# Patient Record
Sex: Female | Born: 1990 | Race: White | Hispanic: No | Marital: Single | Smoking: Never smoker
Health system: Southern US, Community
[De-identification: ages and names within clinical notes are randomized; demographics above are authoritative.]

## PROBLEM LIST (undated history)

## (undated) ENCOUNTER — Inpatient Hospital Stay: Payer: Self-pay

## (undated) DIAGNOSIS — A539 Syphilis, unspecified: Secondary | ICD-10-CM

## (undated) HISTORY — DX: Syphilis, unspecified: A53.9

---
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
## 2009-02-15 ENCOUNTER — Observation Stay: Payer: Self-pay | Admitting: Obstetrics and Gynecology

## 2009-03-08 ENCOUNTER — Observation Stay: Payer: Self-pay

## 2009-03-16 ENCOUNTER — Inpatient Hospital Stay: Payer: Self-pay

## 2010-10-11 ENCOUNTER — Emergency Department: Payer: Self-pay | Admitting: Emergency Medicine

## 2011-04-08 ENCOUNTER — Emergency Department: Payer: Self-pay | Admitting: Emergency Medicine

## 2011-04-08 LAB — URINALYSIS, COMPLETE
Glucose,UR: NEGATIVE mg/dL (ref 0–75)
Ketone: NEGATIVE
Ph: 7 (ref 4.5–8.0)
RBC,UR: 129 /HPF (ref 0–5)
Specific Gravity: 1.023 (ref 1.003–1.030)
Squamous Epithelial: 4
WBC UR: 162 /HPF (ref 0–5)

## 2011-04-10 LAB — URINE CULTURE

## 2011-07-28 ENCOUNTER — Emergency Department: Payer: Self-pay | Admitting: Emergency Medicine

## 2011-07-28 LAB — COMPREHENSIVE METABOLIC PANEL
Anion Gap: 8 (ref 7–16)
BUN: 10 mg/dL (ref 7–18)
Bilirubin,Total: 0.3 mg/dL (ref 0.2–1.0)
Calcium, Total: 9.5 mg/dL (ref 8.5–10.1)
Chloride: 104 mmol/L (ref 98–107)
Creatinine: 0.78 mg/dL (ref 0.60–1.30)
EGFR (African American): >60
Glucose: 88 mg/dL (ref 65–99)
Potassium: 3.8 mmol/L (ref 3.5–5.1)
SGPT (ALT): 17 U/L
Sodium: 140 mmol/L (ref 136–145)
Total Protein: 8.1 g/dL (ref 6.4–8.2)

## 2011-07-28 LAB — URINALYSIS, COMPLETE
Bilirubin,UR: NEGATIVE
Ketone: NEGATIVE
Leukocyte Esterase: NEGATIVE
Nitrite: NEGATIVE
Ph: 5 (ref 4.5–8.0)
Protein: NEGATIVE
RBC,UR: 1 /HPF (ref 0–5)
Specific Gravity: 1.026 (ref 1.003–1.030)
Squamous Epithelial: 2
WBC UR: 1 /HPF (ref 0–5)

## 2011-07-28 LAB — CBC
MCH: 28.4 pg (ref 26.0–34.0)
Platelet: 228 10*3/uL (ref 150–440)
RBC: 4.7 10*6/uL (ref 3.80–5.20)
RDW: 12.9 % (ref 11.5–14.5)
WBC: 14.2 10*3/uL — ABNORMAL HIGH (ref 3.6–11.0)

## 2011-07-28 LAB — PREGNANCY, URINE: Pregnancy Test, Urine: NEGATIVE m[IU]/mL

## 2011-10-01 ENCOUNTER — Emergency Department: Payer: Self-pay | Admitting: *Deleted

## 2011-10-02 LAB — BASIC METABOLIC PANEL
BUN: 8 mg/dL (ref 7–18)
Calcium, Total: 9.2 mg/dL (ref 8.5–10.1)
Chloride: 106 mmol/L (ref 98–107)
Co2: 27 mmol/L (ref 21–32)
Glucose: 99 mg/dL (ref 65–99)
Osmolality: 280 (ref 275–301)
Potassium: 3.7 mmol/L (ref 3.5–5.1)
Sodium: 141 mmol/L (ref 136–145)

## 2011-10-02 LAB — URINALYSIS, COMPLETE
Bacteria: NONE SEEN
Bilirubin,UR: NEGATIVE
Blood: NEGATIVE
Glucose,UR: NEGATIVE mg/dL (ref 0–75)
Leukocyte Esterase: NEGATIVE
Ph: 6 (ref 4.5–8.0)
Specific Gravity: 1.009 (ref 1.003–1.030)
Squamous Epithelial: <1
WBC UR: <1 /HPF (ref 0–5)

## 2011-10-02 LAB — CBC
HCT: 38 % (ref 35.0–47.0)
HGB: 12.4 g/dL (ref 12.0–16.0)
MCH: 28.8 pg (ref 26.0–34.0)
MCHC: 32.6 g/dL (ref 32.0–36.0)
Platelet: 233 10*3/uL (ref 150–440)
RDW: 13.5 % (ref 11.5–14.5)
WBC: 13.8 10*3/uL — ABNORMAL HIGH (ref 3.6–11.0)

## 2011-10-02 LAB — WET PREP, GENITAL

## 2011-10-02 LAB — HCG, QUANTITATIVE, PREGNANCY: Beta Hcg, Quant.: 2137 m[IU]/mL — ABNORMAL HIGH

## 2011-10-02 LAB — PREGNANCY, URINE: Pregnancy Test, Urine: POSITIVE m[IU]/mL

## 2011-12-29 ENCOUNTER — Emergency Department: Payer: Self-pay | Admitting: Emergency Medicine

## 2011-12-29 LAB — URINALYSIS, COMPLETE
Bilirubin,UR: NEGATIVE
Blood: NEGATIVE
Ketone: NEGATIVE
Ph: 7 (ref 4.5–8.0)
Protein: NEGATIVE
Specific Gravity: 1.002 (ref 1.003–1.030)

## 2011-12-29 LAB — HCG, QUANTITATIVE, PREGNANCY: Beta Hcg, Quant.: 14374 m[IU]/mL — ABNORMAL HIGH

## 2011-12-29 LAB — CBC
HCT: 35.8 % (ref 35.0–47.0)
HGB: 12.3 g/dL (ref 12.0–16.0)
MCV: 88 fL (ref 80–100)
RDW: 13.4 % (ref 11.5–14.5)
WBC: 10.8 10*3/uL (ref 3.6–11.0)

## 2011-12-29 LAB — BASIC METABOLIC PANEL
Anion Gap: 11 (ref 7–16)
BUN: 4 mg/dL — ABNORMAL LOW (ref 7–18)
EGFR (African American): >60
EGFR (Non-African Amer.): >60
Glucose: 82 mg/dL (ref 65–99)
Osmolality: 272 (ref 275–301)

## 2012-04-25 ENCOUNTER — Observation Stay: Payer: Self-pay | Admitting: Obstetrics & Gynecology

## 2012-05-03 ENCOUNTER — Inpatient Hospital Stay: Payer: Self-pay | Admitting: Obstetrics and Gynecology

## 2012-05-03 LAB — CBC WITH DIFFERENTIAL/PLATELET
Basophil %: 0.4 %
Eosinophil #: 0.1 10*3/uL (ref 0.0–0.7)
Eosinophil %: 0.8 %
HCT: 29.5 % — ABNORMAL LOW (ref 35.0–47.0)
HGB: 9.5 g/dL — ABNORMAL LOW (ref 12.0–16.0)
Lymphocyte #: 2.3 10*3/uL (ref 1.0–3.6)
Lymphocyte %: 16.8 %
MCH: 27 pg (ref 26.0–34.0)
MCHC: 32.3 g/dL (ref 32.0–36.0)
MCV: 84 fL (ref 80–100)
Monocyte #: 1.4 x10 3/mm — ABNORMAL HIGH (ref 0.2–0.9)
Neutrophil #: 9.6 10*3/uL — ABNORMAL HIGH (ref 1.4–6.5)
Platelet: 205 10*3/uL (ref 150–440)
RBC: 3.53 10*6/uL — ABNORMAL LOW (ref 3.80–5.20)
RDW: 14 % (ref 11.5–14.5)

## 2012-05-04 LAB — HEMATOCRIT: HCT: 28.8 % — ABNORMAL LOW (ref 35.0–47.0)

## 2012-05-05 LAB — BETA STREP CULTURE(ARMC)

## 2013-10-19 ENCOUNTER — Emergency Department: Payer: Self-pay | Admitting: Emergency Medicine

## 2014-06-07 NOTE — Consult Note (Signed)
PREGNANCY and LABOR:  Maternal Age 25   Gravida 2   Para 1   Term Deliveries 1   Preterm Deliveries 0   Abortions 0   Living Children 1   Final EDD ([id number]) 01-Jun-2012   GA Assessment: (Weeks) 35 week(s)   (Days) 6 day(s)   Gestation Single   Blood Type (Maternal) O negative, s/p rhogam   Antibody Screen Results (Maternal) negative   Indirect [place] Unknown   HIV Results (Maternal) unknown   Gonorrhea Results (Maternal) negative   Chlamydia Results (Maternal) negative   Hepatitis C Culture (Maternal) unknown   Herpes Results (Maternal) n/a   Varicella Titer Results (Maternal) Unknown   VDRL/RPR/Syphilis Results (Maternal) negative   Rubella Results (Maternal) immune   Hepatitis B Surface Antigen Results (Maternal) negative   Group B Strep Results Maternal (Result >5wks must be treated as unknown) unknown/result > 5 weeks ago  GBS prophylaxis initiated, and sample obtained prior to treatment   GBS Prophylaxis Adequate   GBS Prophylaxis Date/Time 03-May-2012   GBS Prophylaxis hours prior to delivery >4   Other Maternal Labs PNV   Prenatal Care Adequate   Family/Social History has 24 year old daughter at home.   Labor Spontaneous   Pregnancy/Labor Complications Pre-term labor   Pregnancy/Labor Addendum Dating [place] at 5 weeks    Medications 1. PNV    Active Problems 1. PTL at [redacted] weeks gestation 2. GBS unknown   Newborn Classification: Newborn Classification: Prematurity, Other .   Comments I was asked to consult on Jaloni Menendez by Dr. Izora Gala for concerns of PTL at [redacted] weeks gestation with unknown GBS status.  Mom is O-, HIV and GBS unknown, with otherwise reassuring serologies.  Mom did receive Rhogam around [redacted] weeks gestation.  Mom reprots other child was term, so concerned about preterm delivery.  I reviewed common causes of late preterm delivery and possible needs for neonatal care. Briefly reviewed different needs of term verse late  preterm infants.  Mom expressed understanding and appreciation for the consultation, and I answered mom's questions.  Mom desires to bottle feed.  Thank you for allowing me to particiapte in her care.   Plan 1. Recommend GBS prophylaxis as per MMWR/CDC guidelines 2. Request follow-up on maternal HIV status 3. Recommend infant blood typing and DAT status due to maternal O- status 4. Please contact me if any additional questions or concerns arrise.   Additional Comments I spent 45 minutes between chart view, discussion with medical team, and face-to-face time with patient.   Parental Contact: Parental Contact: The parents were informed at length regarding the infant's condition and plan.  Thank you: Thank you for this consult..  Electronic Signatures: Eda Keys (MD)  (Signed 19-Mar-14 12:24)  Authored: PREGNANCY and LABOR, MEDICATIONS, ACTIVE PROBLEM LIST, NEWBORN CLASSIFICATION, ADDITIONAL COMMENTS, PARENTAL CONTACT, THANK YOU   Last Updated: 19-Mar-14 12:24 by Eda Keys (MD)

## 2014-06-23 ENCOUNTER — Emergency Department
Admission: EM | Admit: 2014-06-23 | Discharge: 2014-06-23 | Disposition: A | Discharge status: Home / Self Care | Payer: BLUE CROSS/BLUE SHIELD | Attending: Emergency Medicine | Admitting: Emergency Medicine

## 2014-06-23 ENCOUNTER — Encounter: Payer: Self-pay | Admitting: *Deleted

## 2014-06-23 DIAGNOSIS — H18823 Corneal disorder due to contact lens, bilateral: Secondary | ICD-10-CM | POA: Diagnosis not present

## 2014-06-23 DIAGNOSIS — H5713 Ocular pain, bilateral: Secondary | ICD-10-CM | POA: Diagnosis present

## 2014-06-23 MED ORDER — FLUORESCEIN SODIUM 1 MG OP STRP
ORAL_STRIP | OPHTHALMIC | Status: AC
  Administered 2014-06-23: 15:00:00
  Filled 2014-06-23: qty 1

## 2014-06-23 MED ORDER — EYE WASH OPHTH SOLN
OPHTHALMIC | Status: AC
  Administered 2014-06-23: 15:00:00
  Filled 2014-06-23: qty 118

## 2014-06-23 MED ORDER — TETRACAINE HCL 0.5 % OP SOLN
OPHTHALMIC | Status: AC
  Administered 2014-06-23: 15:00:00
  Filled 2014-06-23: qty 2

## 2014-06-23 MED ORDER — TOBRAMYCIN 0.3 % OP SOLN
2.0000 [drp] | OPHTHALMIC | Status: AC
Start: 2014-06-23 — End: 2014-06-28

## 2014-06-23 NOTE — Discharge Instructions (Signed)
Do not wear contacts until completely cleared or follow up with [facility]

## 2014-06-23 NOTE — ED Notes (Signed)
Pt c/o BL eye pain with redness since yesterday..states she took her contacts out yesterday

## 2014-06-23 NOTE — ED Provider Notes (Signed)
[facility] Emergency Department Provider Note  ____________________________________________  Time seen: Approximately 2:29 PM  I have reviewed the triage vital signs and the nursing notes.   HISTORY  Chief Complaint Eye Pain   HPI Rebecca Francis is a 24 y.o. female with complaint of bilateral eye pain and redness. She wears continuous extended wear contacts. Currently she has them out.She states that the pain started yesterday. That is when she took her contacts out. She rates her pain at 10 over 10. She states by light hurts her eyes and wearing sunglasses helps.   History reviewed. No pertinent past medical history.  There are no active problems to display for this patient.   History reviewed. No pertinent past surgical history.  Current Outpatient Rx  Name  Route  Sig  Dispense  Refill  . tobramycin (TOBREX) 0.3 % ophthalmic solution   Both Eyes   Place 2 drops into both eyes every 4 (four) hours.   5 mL   0     Allergies Review of patient's allergies indicates no known allergies.  No family history on file.  Social History History  Substance Use Topics  . Smoking status: Never Smoker   . Smokeless tobacco: Not on file  . Alcohol Use: No    Review of Systems Constitutional: No fever/chills Eyes: No visual changes. ENT: No sore throat. Cardiovascular: Denies chest pain. Respiratory: Denies shortness of breath. Gastrointestinal: No abdominal pain.  No nausea, no vomiting. Genitourinary: Negative for dysuria. Musculoskeletal: Negative for back pain. Skin: Negative for rash. Neurological: Negative for headaches,  10-point ROS otherwise negative.  ____________________________________________   PHYSICAL EXAM:  VITAL SIGNS: ED Triage Vitals  Enc Vitals Group     BP 06/23/14 1253 122/74 mmHg     Pulse Rate 06/23/14 1253 91     Resp 06/23/14 1253 16     Temp 06/23/14 1253 97.6 F (36.4 C)     Temp Source 06/23/14 1253 Oral      SpO2 06/23/14 1253 100 %     Weight 06/23/14 1253 150 lb (68.04 kg)     Height 06/23/14 1253 5\' 3"  (1.6 m)     Head Cir --      Peak Flow --      Pain Score 06/23/14 1253 5     Pain Loc --      Pain Edu? --      Excl. in [place]? --     Constitutional: Alert and oriented. Well appearing and in no acute distress. Eyes: Marked bilateral conjunctival injection. Clear drainage bilaterally. Pupils equal round and reactive to light, EOM intact. Head: Atraumatic. Nose: No congestion/rhinnorhea. Mouth/Throat: Mucous membranes are moist.  Oropharynx non-erythematous. Neck: No stridor.   Cardiovascular: Normal rate, regular rhythm. Grossly normal heart sounds.  Good peripheral circulation. Respiratory: Normal respiratory effort.  No retractions. Lungs CTAB. Gastrointestinal: Soft and nontender. No distention. No abdominal bruits. No CVA tenderness. Musculoskeletal: No lower extremity tenderness nor edema.  No joint effusions. Neurologic:  Normal speech and language. No gross focal neurologic deficits are appreciated. Speech is normal. No gait instability. Skin:  Skin is warm, dry and intact. No rash noted. Psychiatric: Mood and affect are normal. Speech and behavior are normal.  ____________________________________________   LABS (all labs ordered are listed, but only abnormal results are displayed)  Labs Reviewed - No data to display ____________________________________________  EKG  Not done ____________________________________________  RADIOLOGY  I done ____________________________________________   PROCEDURES  Procedure(s) performed: Tetracaine was placed  in both eyes. Bilateral upper eyelids were inverted with no foreign body noted. Floor seen dye was placed in each eye. Bilateral corneal abrasions noted lateral aspect of each eye. No foreign body. Each eye was irrigated with eyewash. Patient tolerated the procedure well.  Critical Care performed:  No  ____________________________________________   INITIAL IMPRESSION / ASSESSMENT AND PLAN / ED COURSE  Pertinent labs & imaging results that were available during my care of the patient were reviewed by me and considered in my medical decision making (see chart for details).  Patient is to leave contacts out and used eyedrops prescribed for her today. She is to follow-up [facility]. She was given a work note for 2 days. ____________________________________________   FINAL CLINICAL IMPRESSION(S) / ED DIAGNOSES  Final diagnoses:  Corneal abrasion of both eyes due to contact lens     Johnn Hai, PA-C 06/23/14 [place], MD 06/23/14 [phone]

## 2014-06-25 NOTE — H&P (Signed)
L&D Evaluation:  History:  HPI 24yo G2P1001 at [redacted]w[redacted]d by 5wk U/S presents with LOF at 0815 today.  No contractions yet.  PNC at [facility].   PNL - O- (s/p rhogam), VNI, RI, GBS unk, ASCUS HPV - pap.   Presents with leaking fluid   Patient's Medical History No Chronic Illness   Patient's Surgical History none   Medications Tums, pepcid   Allergies NKDA   Social History none   Family History Non-Contributory   ROS:  ROS All systems were reviewed.  HEENT, CNS, GI, GU, Respiratory, CV, Renal and Musculoskeletal systems were found to be normal.   Exam:  Vital Signs stable   General no apparent distress   Mental Status clear   Chest normal effort   Abdomen gravid, non-tender   Estimated Fetal Weight Average for gestational age, 6#   Pelvic no external lesions, 3-4cm per RN   Mebranes Ruptured   Description clear   FHT normal rate with no decels, reactive NST   Ucx irregular, every 104min, but pt doesn't feel them   Skin dry   Impression:  Impression G2P1001 at [redacted]w[redacted]d by 5wk U/S with PTL   Plan:  Plan EFM/NST, monitor contractions and for cervical change, antibiotics for GBBS prophylaxis   Comments - start pitocin for augmentation of labor - will plan on epidural for pain control   Electronic Signatures: Thurmond Butts, Elinor Parkinson (MD)  (Signed 19-Mar-14 10:05)  Authored: L&D Evaluation   Last Updated: 19-Mar-14 10:05 by Thurmond Butts, Elinor Parkinson (MD)

## 2015-02-07 ENCOUNTER — Emergency Department
Admission: EM | Admit: 2015-02-07 | Discharge: 2015-02-07 | Disposition: A | Discharge status: Home / Self Care | Payer: BLUE CROSS/BLUE SHIELD | Attending: Emergency Medicine | Admitting: Emergency Medicine

## 2015-02-07 ENCOUNTER — Encounter: Payer: Self-pay | Admitting: *Deleted

## 2015-02-07 DIAGNOSIS — K0889 Other specified disorders of teeth and supporting structures: Secondary | ICD-10-CM | POA: Diagnosis not present

## 2015-02-07 DIAGNOSIS — K0381 Cracked tooth: Secondary | ICD-10-CM | POA: Diagnosis not present

## 2015-02-07 MED ORDER — LIDOCAINE VISCOUS 2 % MT SOLN: 20.0000 mL | OROMUCOSAL | Status: DC | PRN

## 2015-02-07 MED ORDER — AMOXICILLIN 500 MG PO TABS: 500.0000 mg | ORAL_TABLET | Freq: Two times a day (BID) | ORAL | Status: DC

## 2015-02-07 MED ORDER — NAPROXEN 500 MG PO TABS: 500.0000 mg | ORAL_TABLET | Freq: Two times a day (BID) | ORAL | Status: DC

## 2015-02-07 MED ORDER — HYDROCODONE-ACETAMINOPHEN 5-325 MG PO TABS: 1.0000 | ORAL_TABLET | ORAL | Status: DC | PRN

## 2015-02-07 NOTE — ED Provider Notes (Signed)
[facility] Emergency Department Provider Note  ____________________________________________  Time seen: Approximately 9:20 AM  I have reviewed the triage vital signs and the nursing notes.   HISTORY  Chief Complaint Dental Pain    HPI Rebecca Francis is a 24 y.o. female patient presents for evaluation of dental pain.She reports a cracked upper left tooth molar. Some obvious erythema. Attempted to contact her dentist this morning, however the office is closed until next week.   History reviewed. No pertinent past medical history.  There are no active problems to display for this patient.   History reviewed. No pertinent past surgical history.  Current Outpatient Rx  Name  Route  Sig  Dispense  Refill  . amoxicillin (AMOXIL) 500 MG tablet   Oral   Take 1 tablet (500 mg total) by mouth 2 (two) times daily.   20 tablet   0   . HYDROcodone-acetaminophen (NORCO) 5-325 MG tablet   Oral   Take 1-2 tablets by mouth every 4 (four) hours as needed for moderate pain.   15 tablet   0   . lidocaine (XYLOCAINE) 2 % solution   Mouth/Throat   Use as directed 20 mLs in the mouth or throat as needed for mouth pain.   100 mL   0   . naproxen (NAPROSYN) 500 MG tablet   Oral   Take 1 tablet (500 mg total) by mouth 2 (two) times daily with a meal.   60 tablet   0     Allergies Review of patient's allergies indicates no known allergies.  No family history on file.  Social History Social History  Substance Use Topics  . Smoking status: Never Smoker   . Smokeless tobacco: None  . Alcohol Use: No    Review of Systems Constitutional: No fever/chills Eyes: No visual changes. ENT: No sore throat. Positive for dental pain. Cardiovascular: Denies chest pain. Respiratory: Denies shortness of breath. Gastrointestinal: No abdominal pain.  No nausea, no vomiting.  No diarrhea.  No constipation. Genitourinary: Negative for dysuria. Musculoskeletal:  Negative for back pain. Skin: Negative for rash. Neurological: Negative for headaches, focal weakness or numbness.  10-point ROS otherwise negative.  ____________________________________________   PHYSICAL EXAM:  VITAL SIGNS: ED Triage Vitals  Enc Vitals Group     BP 02/07/15 0918 116/58 mmHg     Pulse Rate 02/07/15 0918 89     Resp 02/07/15 0918 20     Temp 02/07/15 0918 98.1 F (36.7 C)     Temp Source 02/07/15 0918 Axillary     SpO2 02/07/15 0918 100 %     Weight 02/07/15 0918 155 lb (70.308 kg)     Height 02/07/15 0918 5\' 2"  (1.575 m)     Head Cir --      Peak Flow --      Pain Score 02/07/15 0919 10     Pain Loc --      Pain Edu? --      Excl. in [place]? --     Constitutional: Alert and oriented. Well appearing and in no acute distress. Eyes: Conjunctivae are normal. PERRL. EOMI. Head: Atraumatic. Nose: No congestion/rhinnorhea. Mouth/Throat: Mucous membranes are moist.  Oropharynx non-erythematous. Positive fracture to his left upper molar with some mild erythema noted to the gumline. Neck: No stridor.   Cardio vascular: Normal rate, regular rhythm. Grossly normal heart sounds.  Good peripheral circulation. Respiratory: Normal respiratory effort.  No retractions. Lungs CTAB.  Musculoskeletal: No lower extremity tenderness nor  edema.  No joint effusions. Neurologic:  Normal speech and language. No gross focal neurologic deficits are appreciated. No gait instability. Skin:  Skin is warm, dry and intact. No rash noted. Psychiatric: Mood and affect are normal. Speech and behavior are normal.  ____________________________________________   LABS (all labs ordered are listed, but only abnormal results are displayed)  Labs Reviewed - No data to display ____________________________________________  PROCEDURES  Procedure(s) performed: None  Critical Care performed: No  ____________________________________________   INITIAL IMPRESSION / ASSESSMENT AND PLAN / ED  COURSE  Pertinent labs & imaging results that were available during my care of the patient were reviewed by me and considered in my medical decision making (see chart for details).  Dental pain acute with dental caries. Fractured tooth. Rx given for Amoxil 500 mg 3 times a day, Naprosyn 500 mg twice a day, viscous lidocaine 2%. In addition Vicodin 5/325 was given for immediate pain relief. Patient follow-up with her dentist next week as scheduled. ____________________________________________   FINAL CLINICAL IMPRESSION(S) / ED DIAGNOSES  Final diagnoses:  Pain, dental      Arlyss Repress, PA-C 02/07/15 [id number]  Daymon Larsen, MD 02/07/15 [phone]

## 2015-02-07 NOTE — Discharge Instructions (Signed)
OPTIONS FOR DENTAL FOLLOW UP CARE ° °[place] Department of Health and Human Services - Local Safety Net Dental Clinics °http://www.ncdhhs.gov/dph/oralhealth/services/safetynetclinics.htm °  °[facility] (336-562-3123) ° °[facility] (919-933-9087) ° °[facility] (919-663-1744 ext 237) ° °[facility] ([phone]) ° °[facility] ([phone]) °This clinic caters to the indigent population and is on a lottery system. °Location: °[facility], [address], [place] °Clinic Hours: °Wednesdays from 6pm - 9pm, patients seen by a lottery system. °For dates, call or go to [email or web address] °Services: °Cleanings, fillings and simple extractions. °Payment Options: °DENTAL WORK IS FREE OF CHARGE. Bring proof of income or support. °Best way to get seen: °Arrive at 5:15 pm - this is a lottery, NOT first come/first serve, so arriving earlier will not increase your chances of being seen. °  °  °[facility] °[phone] °Select option 1 for emergencies °  °Location: °[facility], [address], [place] °Clinic Hours: °No walk-ins accepted - call the day before to schedule an appointment. °Check in times are 9:30 am and 1:30 pm. °Services: °Simple extractions, temporary fillings, pulpectomy/pulp debridement, uncomplicated abscess drainage. °Payment Options: °PAYMENT IS DUE AT THE TIME OF SERVICE.  Fee is usually $100-200, additional surgical procedures (e.g. abscess drainage) may be extra. °Cash, checks, Visa/MasterCard accepted.  Can file Medicaid if patient is covered for dental - patient should call case worker to check. °No discount for [facility] patients. °Best way to get seen: °MUST call the day before and get onto the schedule. Can usually be seen the next 1-2 days. No walk-ins accepted. °  °  °[facility] °[phone] °   °Location: °[facility], [address], [place] °Clinic Hours: °M, W, Th, F 8am or 1:30pm, Tues 9a or 1:30 - first come/first served. °Services: °Simple extractions, temporary fillings, uncomplicated abscess drainage.  You do not need to be an [place] resident. °Payment Options: °PAYMENT IS DUE AT THE TIME OF SERVICE. °Dental insurance, otherwise sliding scale - bring proof of income or support. °Depending on income and treatment needed, cost is usually $50-200. °Best way to get seen: °Arrive early as it is first come/first served. °  °  °[facility] °[phone] °  °Location: °[address] °Clinic Hours: °Mon-Thu 8a-5p °Services: °Most basic dental services including extractions and fillings. °Payment Options: °PAYMENT IS DUE AT THE TIME OF SERVICE. °Sliding scale, up to 50% off - bring proof if income or support. °Medicaid with dental option accepted. °Best way to get seen: °Call to schedule an appointment, can usually be seen within 2 weeks OR they will try to see walk-ins - show up at 8a or 2p (you may have to wait). °  °  °[facility] °919-245-2435 °[place] RESIDENTS ONLY °  °Location: °[facility], [address], [place], [place] [address] °Clinic Hours: By appointment only. °Monday - Thursday 8am-5pm, Friday 8am-12pm °Services: Cleanings, fillings, extractions. °Payment Options: °PAYMENT IS DUE AT THE TIME OF SERVICE. °Cash, Visa or MasterCard. Sliding scale - $30 minimum per service. °Best way to get seen: °Come in to office, complete packet and make an appointment - need proof of income °or support monies for each household member and proof of [place] residence. °Usually takes about a month to get in. °  °  °[facility] °[phone] °  °Location: °[address]   [place] °Clinic Hours: Walk-in Urgent Care Dental Services are offered Monday-Friday  mornings only. °The numbers of emergencies accepted daily is limited to the number of °providers available. °Maximum 15 - Mondays, Wednesdays & Thursdays °Maximum 10 - Tuesdays & Fridays °Services: °You do not need to be a [place] resident to be seen for a dental emergency. °Emergencies are defined as pain, swelling, abnormal bleeding, or dental trauma. Walkins will receive x-rays if needed. °NOTE: Dental cleaning is not an emergency. °Payment Options: °PAYMENT IS DUE AT THE TIME OF SERVICE. °Minimum co-pay is $40.00 for uninsured patients. °Minimum co-pay is $3.00 for Medicaid with dental coverage. °Dental Insurance is accepted and must be presented at time of visit. °Medicare does not cover dental. °Forms of payment: Cash, credit card, checks. °Best way to get seen: °If not previously registered with the clinic, walk-in dental registration begins at 7:15 am and is on a first come/first serve basis. °If previously registered with the clinic, call to make an appointment. °  °  °[facility] °[phone] °[place] RESIDENTS ONLY °  °Location: °[address], [place], [place] °Clinic Hours: °Mon-Thu 10a-2p °Services: Extractions only! °Payment Options: °FREE (donations accepted) - bring proof of income or support °Best way to get seen: °Call and schedule an appointment OR come at 8am on the 1st Monday of every month (except for holidays) when it is first come/first served. °  °  °[facility] °[phone] °  °Location: °[address], [place] °Clinic Hours: °Friday mornings °Services, Payment Options, Best way to get seen: °Call for infoOPTIONS FOR DENTAL FOLLOW UP CARE ° °[place] Department of Health and Human Services - Local Safety Net Dental Clinics °http://www.ncdhhs.gov/dph/oralhealth/services/safetynetclinics.htm °  °[facility] (336-562-3123) ° °[facility] ([phone]) ° °[facility] ([phone] ext 237) ° °[facility]  ([phone]) ° °[facility] ([phone]) °This clinic caters to the indigent population and is on a lottery system. °Location: °[facility], [address], [place] °Clinic Hours: °Wednesdays from 6pm - 9pm, patients seen by a lottery system. °For dates, call or go to [email or web address] °Services: °Cleanings, fillings and simple extractions. °Payment Options: °DENTAL WORK IS FREE OF CHARGE. Bring proof of income or support. °Best way to get seen: °Arrive at 5:15 pm - this is a lottery, NOT first come/first serve, so arriving earlier will not increase your chances of being seen. °  °  °[facility] °[phone] °Select option 1 for emergencies °  °Location: °[facility], [address], [place] °Clinic Hours: °No walk-ins accepted - call the day before to schedule an appointment. °Check in times are 9:30 am and 1:30 pm. °Services: °Simple extractions, temporary fillings, pulpectomy/pulp debridement, uncomplicated abscess drainage. °Payment Options: °PAYMENT IS DUE AT THE TIME OF SERVICE.  Fee is usually $100-200, additional surgical procedures (e.g. abscess drainage) may be extra. °Cash, checks, Visa/MasterCard accepted.  Can file Medicaid if patient is covered for dental - patient should call case worker to check. °No discount for [facility] patients. °Best way to get seen: °MUST call the day before and get onto the schedule. Can usually be seen the next 1-2 days. No walk-ins accepted. °  °  °[facility] °[phone] °  °Location: °[facility], [address], [place] °Clinic Hours: °M, W, Th, F 8am or 1:30pm, Tues 9a or 1:30 - first come/first served. °Services: °Simple extractions, temporary fillings, uncomplicated   abscess drainage.  You do not need to be an [facility] resident. Payment Options: PAYMENT IS DUE AT THE TIME OF SERVICE. Dental insurance,  otherwise sliding scale - bring proof of income or support. Depending on income and treatment needed, cost is usually $50-200. Best way to get seen: Arrive early as it is first come/first served.     [facility] [phone]   Location: [facility] Hours: Mon-Thu 8a-5p Services: Most basic dental services including extractions and fillings. Payment Options: PAYMENT IS DUE AT THE TIME OF SERVICE. Sliding scale, up to 50% off - bring proof if income or support. Medicaid with dental option accepted. Best way to get seen: Call to schedule an appointment, can usually be seen within 2 weeks OR they will try to see walk-ins - show up at [place] or 2p (you may have to wait).     [facility] [place] RESIDENTS ONLY   Location: [facility] [place] [address] [place], [place] [address] Clinic Hours: By appointment only. Monday - Thursday 8am-5pm, Friday 8am-12pm Services: Cleanings, fillings, extractions. Payment Options: PAYMENT IS DUE AT THE TIME OF SERVICE. Cash, Visa or MasterCard. Sliding scale - $30 minimum per service. Best way to get seen: Come in to office, complete packet and make an appointment - need proof of income or support monies for each household member and proof of [facility] residence. Usually takes about a month to get in.     [facility] [phone]   Location: [address], [place] Clinic Hours: Walk-in Urgent Care Dental Services are offered Monday-Friday mornings only. The numbers of emergencies accepted daily is limited to the number of providers available. Maximum 15 - Mondays, Wednesdays & Thursdays Maximum 10 - Tuesdays & Fridays Services: You do not need to be a [facility] resident to be seen for a dental emergency. Emergencies are defined as pain, swelling, abnormal bleeding, or dental trauma. Walkins  will receive x-rays if needed. NOTE: Dental cleaning is not an emergency. Payment Options: PAYMENT IS DUE AT THE TIME OF SERVICE. Minimum co-pay is $40.00 for uninsured patients. Minimum co-pay is $3.00 for Medicaid with dental coverage. Dental Insurance is accepted and must be presented at time of visit. Medicare does not cover dental. Forms of payment: Cash, credit card, checks. Best way to get seen: If not previously registered with the clinic, walk-in dental registration begins at 7:15 am and is on a first come/first serve basis. If previously registered with the clinic, call to make an appointment.     [facility] [place] ONLY   Location: [address], [place], [place] Clinic Hours: Mon-Thu 10a-2p Services: Extractions only! Payment Options: FREE (donations accepted) - bring proof of income or support Best way to get seen: Call and schedule an appointment OR come at 8am on the 1st Monday of every month (except for holidays) when it is first come/first served.     [facility] [phone]   Location: [place], [place] Clinic Hours: Friday mornings Services, Payment Options, Best way to get seen: Call for info

## 2015-04-06 ENCOUNTER — Encounter: Payer: Self-pay | Admitting: Emergency Medicine

## 2015-04-06 ENCOUNTER — Emergency Department
Admission: EM | Admit: 2015-04-06 | Discharge: 2015-04-06 | Disposition: A | Discharge status: Home / Self Care | Payer: BLUE CROSS/BLUE SHIELD | Attending: Emergency Medicine | Admitting: Emergency Medicine

## 2015-04-06 DIAGNOSIS — N390 Urinary tract infection, site not specified: Secondary | ICD-10-CM | POA: Diagnosis not present

## 2015-04-06 DIAGNOSIS — Z3202 Encounter for pregnancy test, result negative: Secondary | ICD-10-CM | POA: Insufficient documentation

## 2015-04-06 DIAGNOSIS — R109 Unspecified abdominal pain: Secondary | ICD-10-CM | POA: Diagnosis present

## 2015-04-06 DIAGNOSIS — Z791 Long term (current) use of non-steroidal anti-inflammatories (NSAID): Secondary | ICD-10-CM | POA: Insufficient documentation

## 2015-04-06 LAB — URINALYSIS COMPLETE WITH MICROSCOPIC (ARMC ONLY)
Bacteria, UA: NONE SEEN
Bilirubin Urine: NEGATIVE
Glucose, UA: NEGATIVE mg/dL
Ketones, ur: NEGATIVE mg/dL
Nitrite: POSITIVE — AB
PROTEIN: NEGATIVE mg/dL
RBC / HPF: NONE SEEN RBC/hpf (ref 0–5)
SQUAMOUS EPITHELIAL / LPF: NONE SEEN
Specific Gravity, Urine: 1.02 (ref 1.005–1.030)
WBC, UA: NONE SEEN WBC/hpf (ref 0–5)
pH: 5 (ref 5.0–8.0)

## 2015-04-06 LAB — POCT PREGNANCY, URINE: PREG TEST UR: NEGATIVE

## 2015-04-06 MED ORDER — CEPHALEXIN 500 MG PO CAPS: 500.0000 mg | ORAL_CAPSULE | Freq: Two times a day (BID) | ORAL | Status: DC

## 2015-04-06 NOTE — Discharge Instructions (Signed)
Urinary Tract Infection Urinary tract infections (UTIs) can develop anywhere along your urinary tract. Your urinary tract is your body's drainage system for removing wastes and extra water. Your urinary tract includes two kidneys, two ureters, a bladder, and a urethra. Your kidneys are a pair of bean-shaped organs. Each kidney is about the size of your fist. They are located below your ribs, one on each side of your spine. CAUSES Infections are caused by microbes, which are microscopic organisms, including fungi, viruses, and bacteria. These organisms are so small that they can only be seen through a microscope. Bacteria are the microbes that most commonly cause UTIs. SYMPTOMS  Symptoms of UTIs may vary by age and gender of the patient and by the location of the infection. Symptoms in young women typically include a frequent and intense urge to urinate and a painful, burning feeling in the bladder or urethra during urination. Older women and men are more likely to be tired, shaky, and weak and have muscle aches and abdominal pain. A fever may mean the infection is in your kidneys. Other symptoms of a kidney infection include pain in your back or sides below the ribs, nausea, and vomiting. DIAGNOSIS To diagnose a UTI, your caregiver will ask you about your symptoms. Your caregiver will also ask you to provide a urine sample. The urine sample will be tested for bacteria and white blood cells. White blood cells are made by your body to help fight infection. TREATMENT  Typically, UTIs can be treated with medication. Because most UTIs are caused by a bacterial infection, they usually can be treated with the use of antibiotics. The choice of antibiotic and length of treatment depend on your symptoms and the type of bacteria causing your infection. HOME CARE INSTRUCTIONS  If you were prescribed antibiotics, take them exactly as your caregiver instructs you. Finish the medication even if you feel better after  you have only taken some of the medication.  Drink enough water and fluids to keep your urine clear or pale yellow.  Avoid caffeine, tea, and carbonated beverages. They tend to irritate your bladder.  Empty your bladder often. Avoid holding urine for long periods of time.  Empty your bladder before and after sexual intercourse.  After a bowel movement, women should cleanse from front to back. Use each tissue only once. SEEK MEDICAL CARE IF:   You have back pain.  You develop a fever.  Your symptoms do not begin to resolve within 3 days. SEEK IMMEDIATE MEDICAL CARE IF:   You have severe back pain or lower abdominal pain.  You develop chills.  You have nausea or vomiting.  You have continued burning or discomfort with urination. MAKE SURE YOU:   Understand these instructions.  Will watch your condition.  Will get help right away if you are not doing well or get worse.   This information is not intended to replace advice given to you by your health care provider. Make sure you discuss any questions you have with your health care provider.   Document Released: 11/11/2004 Document Revised: 10/23/2014 Document Reviewed: 03/12/2011 Elsevier Interactive Patient Education 2016 [place] the antibiotic as directed until completely gone. Empty your bladder regularly.

## 2015-04-06 NOTE — ED Notes (Addendum)
Pt has been having pain left back for about a week. Also has dysuria and some pain in left side at times.  Thinks she has UTI. Tried cranberry pills but no relief.

## 2015-04-06 NOTE — ED Provider Notes (Signed)
[facility] Emergency Department Provider Note ____________________________________________  Time seen: 1522  I have reviewed the triage vital signs and the nursing notes.  HISTORY  Chief Complaint  Back Pain  HPI Rebecca Francis is a 25 y.o. female presents to the ED for evaluation of left flank pain for about the last week. She also noted some dysuria and urgency the onset of her symptoms. Since the onset she's been dosing over-the-counter Azo for symptom management. She presents today with concern for possible UTI. She denies any fevers, chills, or sweats. She also denies any outright hematuria. Symptoms have been persistent for better than a week at this point.She reports flank pain at a 5/10 in triage.  History reviewed. No pertinent past medical history.  There are no active problems to display for this patient.   History reviewed. No pertinent past surgical history.  Current Outpatient Rx  Name  Route  Sig  Dispense  Refill  . amoxicillin (AMOXIL) 500 MG tablet   Oral   Take 1 tablet (500 mg total) by mouth 2 (two) times daily.   20 tablet   0   . cephALEXin (KEFLEX) 500 MG capsule   Oral   Take 1 capsule (500 mg total) by mouth 2 (two) times daily.   14 capsule   0   . HYDROcodone-acetaminophen (NORCO) 5-325 MG tablet   Oral   Take 1-2 tablets by mouth every 4 (four) hours as needed for moderate pain.   15 tablet   0   . lidocaine (XYLOCAINE) 2 % solution   Mouth/Throat   Use as directed 20 mLs in the mouth or throat as needed for mouth pain.   100 mL   0   . naproxen (NAPROSYN) 500 MG tablet   Oral   Take 1 tablet (500 mg total) by mouth 2 (two) times daily with a meal.   60 tablet   0    Allergies Review of patient's allergies indicates no known allergies.  History reviewed. No pertinent family history.  Social History Social History  Substance Use Topics  . Smoking status: Never Smoker   . Smokeless tobacco: None  .  Alcohol Use: No   Review of Systems  Constitutional: Negative for fever. Eyes: Negative for visual changes. ENT: Negative for sore throat. Cardiovascular: Negative for chest pain. Respiratory: Negative for shortness of breath. Gastrointestinal: Negative for abdominal pain, vomiting and diarrhea. Genitourinary:  Positive for dysuria. Musculoskeletal: Negative for back pain. Skin: Negative for rash. Neurological: Negative for headaches, focal weakness or numbness. ____________________________________________  PHYSICAL EXAM:  VITAL SIGNS: ED Triage Vitals  Enc Vitals Group     BP 04/06/15 1333 126/69 mmHg     Pulse Rate 04/06/15 1333 90     Resp 04/06/15 1333 16     Temp 04/06/15 1333 98.2 F (36.8 C)     Temp Source 04/06/15 1333 Oral     SpO2 04/06/15 1333 100 %     Weight 04/06/15 1333 175 lb (79.379 kg)     Height 04/06/15 1333 5\' 3"  (1.6 m)     Head Cir --      Peak Flow --      Pain Score 04/06/15 1334 5     Pain Loc --      Pain Edu? --      Excl. in [place]? --    Constitutional: Alert and oriented. Well appearing and in no distress. Head: Normocephalic and atraumatic. Eyes: Conjunctivae are normal. PERRL. Normal  extraocular movements Hematological/Lymphatic/Immunological: No cervical lymphadenopathy. Cardiovascular: Normal rate, regular rhythm.  Respiratory: Normal respiratory effort. No wheezes/rales/rhonchi. Gastrointestinal: Soft and nontender. No distention, rebound, guarding, organomegaly. positiveleft CVA tenderness. Musculoskeletal: Nontender with normal range of motion in all extremities.  Neurologic:  Normal gait without ataxia. Normal speech and language. No gross focal neurologic deficits are appreciated. Skin:  Skin is warm, dry and intact. No rash noted. Psychiatric: Mood and affect are normal. Patient exhibits appropriate insight and judgment. ____________________________________________   LABS (pertinent positives/negatives) Labs Reviewed   URINALYSIS COMPLETEWITH MICROSCOPIC ([place] ONLY) - Abnormal; Notable for the following:    Color, Urine YELLOW (*)    APPearance CLOUDY (*)    Hgb urine dipstick 1+ (*)    Nitrite POSITIVE (*)    Leukocytes, UA 3+ (*)    All other components within normal limits  POC URINE PREG, ED  POCT PREGNANCY, URINE  ____________________________________________  INITIAL IMPRESSION / ASSESSMENT AND PLAN / ED COURE Patient with 3+ leukocytes and nitrite positive urine on evaluation today. She be treated for a cystitis with a prescription for Keflex to dose as directed. She is encouraged to increase fluid intake and empty bladder on schedule. She'll follow with her primary care provider at the [facility] for ongoing symptom management and test of cure following treatment.  ____________________________________________  FINAL CLINICAL IMPRESSION(S) / ED DIAGNOSES  Final diagnoses:  UTI (lower urinary tract infection)      Melvenia Needles, PA-C 04/06/15 [place], MD 04/06/15 1720

## 2015-05-20 ENCOUNTER — Emergency Department
Admission: EM | Admit: 2015-05-20 | Discharge: 2015-05-20 | Disposition: A | Discharge status: Home / Self Care | Payer: BLUE CROSS/BLUE SHIELD | Attending: Emergency Medicine | Admitting: Emergency Medicine

## 2015-05-20 ENCOUNTER — Encounter: Payer: Self-pay | Admitting: Emergency Medicine

## 2015-05-20 ENCOUNTER — Emergency Department: Payer: BLUE CROSS/BLUE SHIELD

## 2015-05-20 DIAGNOSIS — Y999 Unspecified external cause status: Secondary | ICD-10-CM | POA: Insufficient documentation

## 2015-05-20 DIAGNOSIS — W19XXXA Unspecified fall, initial encounter: Secondary | ICD-10-CM | POA: Insufficient documentation

## 2015-05-20 DIAGNOSIS — Y939 Activity, unspecified: Secondary | ICD-10-CM | POA: Diagnosis not present

## 2015-05-20 DIAGNOSIS — Y929 Unspecified place or not applicable: Secondary | ICD-10-CM | POA: Diagnosis not present

## 2015-05-20 DIAGNOSIS — S93601A Unspecified sprain of right foot, initial encounter: Secondary | ICD-10-CM | POA: Diagnosis not present

## 2015-05-20 DIAGNOSIS — S99921A Unspecified injury of right foot, initial encounter: Secondary | ICD-10-CM | POA: Diagnosis present

## 2015-05-20 MED ORDER — NAPROXEN 500 MG PO TABS: 500.0000 mg | ORAL_TABLET | Freq: Two times a day (BID) | ORAL | Status: DC

## 2015-05-20 NOTE — ED Provider Notes (Signed)
[facility] Emergency Department Provider Note  ____________________________________________  Time seen: Approximately 8:16 AM  I have reviewed the triage vital signs and the nursing notes.   HISTORY  Chief Complaint Foot Pain    HPI Rebecca Francis is a 25 y.o. female patient complain 2-3 months of left foot pain. Patient stated intermittent pain to the right foot. Patient stated pain increases with prolonged standing. Patient states she had a fall yesterday which she believes increased foot pain. No pelvis measures taken the last 2-3 months for this complaint. Patient rates the pain as a 5/10. Patient describes the pain as "achy".   History reviewed. No pertinent past medical history.  There are no active problems to display for this patient.   History reviewed. No pertinent past surgical history.  Current Outpatient Rx  Name  Route  Sig  Dispense  Refill  . amoxicillin (AMOXIL) 500 MG tablet   Oral   Take 1 tablet (500 mg total) by mouth 2 (two) times daily.   20 tablet   0   . cephALEXin (KEFLEX) 500 MG capsule   Oral   Take 1 capsule (500 mg total) by mouth 2 (two) times daily.   14 capsule   0   . HYDROcodone-acetaminophen (NORCO) 5-325 MG tablet   Oral   Take 1-2 tablets by mouth every 4 (four) hours as needed for moderate pain.   15 tablet   0   . lidocaine (XYLOCAINE) 2 % solution   Mouth/Throat   Use as directed 20 mLs in the mouth or throat as needed for mouth pain.   100 mL   0   . naproxen (NAPROSYN) 500 MG tablet   Oral   Take 1 tablet (500 mg total) by mouth 2 (two) times daily with a meal.   60 tablet   0     Allergies Review of patient's allergies indicates no known allergies.  History reviewed. No pertinent family history.  Social History Social History  Substance Use Topics  . Smoking status: Never Smoker   . Smokeless tobacco: None  . Alcohol Use: No    Review of Systems Constitutional: No  fever/chills Eyes: No visual changes. ENT: No sore throat. Cardiovascular: Denies chest pain. Respiratory: Denies shortness of breath. Gastrointestinal: No abdominal pain.  No nausea, no vomiting.  No diarrhea.  No constipation. Genitourinary: Negative for dysuria. Musculoskeletal: Foot pain Skin: Negative for rash. Neurological: Negative for headaches, focal weakness or numbness.    ____________________________________________   PHYSICAL EXAM:  VITAL SIGNS: ED Triage Vitals  Enc Vitals Group     BP 05/20/15 0753 123/65 mmHg     Pulse Rate 05/20/15 0753 77     Resp 05/20/15 0753 18     Temp 05/20/15 0753 98.2 F (36.8 C)     Temp Source 05/20/15 0753 Oral     SpO2 05/20/15 0753 100 %     Weight 05/20/15 0753 190 lb (86.183 kg)     Height 05/20/15 0753 5\' 3"  (1.6 m)     Head Cir --      Peak Flow --      Pain Score 05/20/15 0753 5     Pain Loc --      Pain Edu? --      Excl. in [place]? --     Constitutional: Alert and oriented. Well appearing and in no acute distress. Eyes: Conjunctivae are normal. PERRL. EOMI. Head: Atraumatic. Nose: No congestion/rhinnorhea. Mouth/Throat: Mucous membranes are moist.  Oropharynx non-erythematous. Neck: No stridor.  No cervical spine tenderness to palpation. Cardiovascular: Normal rate, regular rhythm. Grossly normal heart sounds.  Good peripheral circulation. Respiratory: Normal respiratory effort.  No retractions. Lungs CTAB. Gastrointestinal: Soft and nontender. No distention. No abdominal bruits. No CVA tenderness. Musculoskeletal: No obvious deformity except for very high arch. No edema or erythema. Neurologic:  Normal speech and language. No gross focal neurologic deficits are appreciated. No gait instability. Skin:  Skin is warm, dry and intact. No rash noted. Psychiatric: Mood and affect are normal. Speech and behavior are normal.  ____________________________________________   LABS (all labs ordered are listed, but only  abnormal results are displayed)  Labs Reviewed - No data to display ____________________________________________  EKG   ____________________________________________  RADIOLOGY  Soft tissue was edema nose dose aspect the lateral foot. No other acute findings. ____________________________________________   PROCEDURES  Procedure(s) performed: None  Critical Care performed: No  ____________________________________________   INITIAL IMPRESSION / ASSESSMENT AND PLAN / ED COURSE  Pertinent labs & imaging results that were available during my care of the patient were reviewed by me and considered in my medical decision making (see chart for details).  Sprain right foot. Discuss x-ray finding with patient. Patient given prescription for naproxen and advised follow-up with podiatry if condition persists. ____________________________________________   FINAL CLINICAL IMPRESSION(S) / ED DIAGNOSES  Final diagnoses:  Sprain of right foot, initial encounter      Sable Feil, PA-C 05/20/15 [id number]  Lavonia Drafts, MD 05/20/15 1550

## 2015-05-20 NOTE — ED Notes (Signed)
Developed left foot pain about 2-3 months ago w/o injury. States pain is lateral and increased with standing   Also had a fall yesterday and thinks pain is worse. No swelling noted. Positive pulses and good sensation

## 2015-05-20 NOTE — ED Notes (Addendum)
Pt to ed with c/o left foot pain that started about 3 months ago.  Pt states last night she fell in [employer] and has since had increased pain, pt does not appear to be in distress while ambulating. No swelling or bruising noted.

## 2015-05-20 NOTE — Discharge Instructions (Signed)

## 2015-06-30 ENCOUNTER — Emergency Department: Payer: BLUE CROSS/BLUE SHIELD

## 2015-06-30 ENCOUNTER — Emergency Department
Admission: EM | Admit: 2015-06-30 | Discharge: 2015-06-30 | Disposition: A | Discharge status: Home / Self Care | Payer: BLUE CROSS/BLUE SHIELD | Attending: Emergency Medicine | Admitting: Emergency Medicine

## 2015-06-30 ENCOUNTER — Encounter: Payer: Self-pay | Admitting: *Deleted

## 2015-06-30 DIAGNOSIS — R10A0 Flank pain, unspecified side: Secondary | ICD-10-CM

## 2015-06-30 DIAGNOSIS — R109 Unspecified abdominal pain: Secondary | ICD-10-CM | POA: Diagnosis present

## 2015-06-30 DIAGNOSIS — R110 Nausea: Secondary | ICD-10-CM | POA: Diagnosis not present

## 2015-06-30 DIAGNOSIS — N390 Urinary tract infection, site not specified: Secondary | ICD-10-CM | POA: Diagnosis not present

## 2015-06-30 LAB — CBC
HEMATOCRIT: 34.1 % — AB (ref 35.0–47.0)
Hemoglobin: 11.3 g/dL — ABNORMAL LOW (ref 12.0–16.0)
MCH: 27 pg (ref 26.0–34.0)
MCHC: 33 g/dL (ref 32.0–36.0)
MCV: 81.8 fL (ref 80.0–100.0)
Platelets: 195 10*3/uL (ref 150–440)
RBC: 4.17 MIL/uL (ref 3.80–5.20)
RDW: 15.3 % — ABNORMAL HIGH (ref 11.5–14.5)
WBC: 11.8 10*3/uL — ABNORMAL HIGH (ref 3.6–11.0)

## 2015-06-30 LAB — COMPREHENSIVE METABOLIC PANEL
ALT: 22 U/L (ref 14–54)
AST: 20 U/L (ref 15–41)
Albumin: 3.5 g/dL (ref 3.5–5.0)
Alkaline Phosphatase: 94 U/L (ref 38–126)
Anion gap: 8 (ref 5–15)
BUN: 13 mg/dL (ref 6–20)
CHLORIDE: 105 mmol/L (ref 101–111)
CO2: 26 mmol/L (ref 22–32)
Calcium: 9 mg/dL (ref 8.9–10.3)
Creatinine, Ser: 0.64 mg/dL (ref 0.44–1.00)
GFR calc Af Amer: >60 mL/min (ref >60)
GFR calc non Af Amer: >60 mL/min (ref >60)
Glucose, Bld: 83 mg/dL (ref 65–99)
POTASSIUM: 3.3 mmol/L — AB (ref 3.5–5.1)
Sodium: 139 mmol/L (ref 135–145)
TOTAL PROTEIN: 7.8 g/dL (ref 6.5–8.1)
Total Bilirubin: 0.6 mg/dL (ref 0.3–1.2)

## 2015-06-30 LAB — URINALYSIS COMPLETE WITH MICROSCOPIC (ARMC ONLY)
Bilirubin Urine: NEGATIVE
GLUCOSE, UA: NEGATIVE mg/dL
Ketones, ur: NEGATIVE mg/dL
LEUKOCYTES UA: NEGATIVE
Nitrite: POSITIVE — AB
PH: 6 (ref 5.0–8.0)
PROTEIN: NEGATIVE mg/dL
SPECIFIC GRAVITY, URINE: 1.019 (ref 1.005–1.030)

## 2015-06-30 LAB — LIPASE, BLOOD: LIPASE: 17 U/L (ref 11–51)

## 2015-06-30 LAB — POCT PREGNANCY, URINE: PREG TEST UR: NEGATIVE

## 2015-06-30 MED ORDER — SULFAMETHOXAZOLE-TRIMETHOPRIM 800-160 MG PO TABS: 1.0000 | ORAL_TABLET | Freq: Two times a day (BID) | ORAL | Status: DC

## 2015-06-30 MED ORDER — AMIODARONE HCL IN DEXTROSE 360-4.14 MG/200ML-% IV SOLN
INTRAVENOUS | Status: AC
  Filled 2015-06-30: qty 200

## 2015-06-30 MED ORDER — PHENAZOPYRIDINE HCL 200 MG PO TABS: 200.0000 mg | ORAL_TABLET | Freq: Three times a day (TID) | ORAL | Status: DC | PRN

## 2015-06-30 NOTE — ED Notes (Signed)
Patient transported to CT 

## 2015-06-30 NOTE — ED Notes (Signed)
Pt complains of left sided abdominal pain with episodes of nausea,pt denies any other symptoms

## 2015-06-30 NOTE — ED Provider Notes (Signed)
[facility] Emergency Department Provider Note        Time seen: ----------------------------------------- 12:55 PM on 06/30/2015 -----------------------------------------    I have reviewed the triage vital signs and the nursing notes.   HISTORY  Chief Complaint Abdominal Pain    HPI Rebecca Francis is a 25 y.o. female who presents ER for left-sided abdominal pain with episodes of nausea. Nothing makes her symptoms better or worse. She denies fevers, chills, chest pain, shortness of breath, nausea vomiting or diarrhea. Patient has never had a history of this before.   History reviewed. No pertinent past medical history.  There are no active problems to display for this patient.   History reviewed. No pertinent past surgical history.  Allergies Review of patient's allergies indicates no known allergies.  Social History Social History  Substance Use Topics  . Smoking status: Never Smoker   . Smokeless tobacco: None  . Alcohol Use: No    Review of Systems Constitutional: Negative for fever. Eyes: Negative for visual changes. ENT: Negative for sore throat. Cardiovascular: Negative for chest pain. Respiratory: Negative for shortness of breath. Gastrointestinal: Positive for abdominal pain, nausea Genitourinary: Negative for dysuria. Musculoskeletal: Negative for back pain. Skin: Negative for rash. Neurological: Negative for headaches, focal weakness or numbness.  10-point ROS otherwise negative.  ____________________________________________   PHYSICAL EXAM:  VITAL SIGNS: ED Triage Vitals  Enc Vitals Group     BP 06/30/15 0947 133/83 mmHg     Pulse Rate 06/30/15 0947 90     Resp 06/30/15 0947 20     Temp 06/30/15 0947 98 F (36.7 C)     Temp Source 06/30/15 0947 Oral     SpO2 06/30/15 0947 100 %     Weight 06/30/15 0947 180 lb (81.647 kg)     Height 06/30/15 0947 5\' 3"  (1.6 m)     Head Cir --      Peak Flow --      Pain Score  06/30/15 0948 7     Pain Loc --      Pain Edu? --      Excl. in [place]? --     Constitutional: Alert and oriented. Well appearing and in no distress. Eyes: Conjunctivae are normal. PERRL. Normal extraocular movements. ENT   Head: Normocephalic and atraumatic.   Nose: No congestion/rhinnorhea.   Mouth/Throat: Mucous membranes are moist.   Neck: No stridor. Cardiovascular: Normal rate, regular rhythm. No murmurs, rubs, or gallops. Respiratory: Normal respiratory effort without tachypnea nor retractions. Breath sounds are clear and equal bilaterally. No wheezes/rales/rhonchi. Gastrointestinal: Soft and nontender. Normal bowel sounds Musculoskeletal: Nontender with normal range of motion in all extremities. No lower extremity tenderness nor edema. Neurologic:  Normal speech and language. No gross focal neurologic deficits are appreciated.  Skin:  Skin is warm, dry and intact. No rash noted. Psychiatric: Mood and affect are normal. Speech and behavior are normal.  ____________________________________________  ED COURSE:  Pertinent labs & imaging results that were available during my care of the patient were reviewed by me and considered in my medical decision making (see chart for details). Patient is in no acute distress, will check basic labs, urinalysis and reevaluate. ____________________________________________    LABS (pertinent positives/negatives)  Labs Reviewed  COMPREHENSIVE METABOLIC PANEL - Abnormal; Notable for the following:    Potassium 3.3 (*)    All other components within normal limits  CBC - Abnormal; Notable for the following:    WBC 11.8 (*)    Hemoglobin  11.3 (*)    HCT 34.1 (*)    RDW 15.3 (*)    All other components within normal limits  URINALYSIS COMPLETEWITH MICROSCOPIC ([facility] ONLY) - Abnormal; Notable for the following:    Color, Urine YELLOW (*)    APPearance HAZY (*)    Hgb urine dipstick 1+ (*)    Nitrite POSITIVE (*)    Bacteria, UA  MANY (*)    Squamous Epithelial / LPF 0-5 (*)    All other components within normal limits  LIPASE, BLOOD  POC URINE PREG, ED  POCT PREGNANCY, URINE    RADIOLOGY Images were viewed by me  CT renal protocol IMPRESSION: No acute findings by CT of the abdomen and pelvis without contrast.  ____________________________________________  FINAL ASSESSMENT AND PLAN  Flank pain, cystitis  Plan: Patient with labs and imaging as dictated above. Unclear etiology for flank pain. She does not clinically have pyelonephritis but will be covered for UTI with Septra. I will encourage Motrin or naproxen for her flank pain. She is stable for outpatient follow-up.   Earleen Newport, MD   Note: This dictation was prepared with Dragon dictation. Any transcriptional errors that result from this process are unintentional   Earleen Newport, MD 06/30/15 1323

## 2015-06-30 NOTE — Discharge Instructions (Signed)
Flank Pain °Flank pain refers to pain that is located on the side of the body between the upper abdomen and the back. The pain may occur over a short period of time (acute) or may be long-term or reoccurring (chronic). It may be mild or severe. Flank pain can be caused by many things. °CAUSES  °Some of the more common causes of flank pain include: °· Muscle strains.   °· Muscle spasms.   °· A disease of your spine (vertebral disk disease).   °· A lung infection (pneumonia).   °· Fluid around your lungs (pulmonary edema).   °· A kidney infection.   °· Kidney stones.   °· A very painful skin rash caused by the chickenpox virus (shingles).   °· Gallbladder disease.   °HOME CARE INSTRUCTIONS  °Home care will depend on the cause of your pain. In general, °· Rest as directed by your caregiver. °· Drink enough fluids to keep your urine clear or pale yellow. °· Only take over-the-counter or prescription medicines as directed by your caregiver. Some medicines may help relieve the pain. °· Tell your caregiver about any changes in your pain. °· Follow up with your caregiver as directed. °SEEK IMMEDIATE MEDICAL CARE IF:  °· Your pain is not controlled with medicine.   °· You have new or worsening symptoms. °· Your pain increases.   °· You have abdominal pain.   °· You have shortness of breath.   °· You have persistent nausea or vomiting.   °· You have swelling in your abdomen.   °· You feel faint or pass out.   °· You have blood in your urine. °· You have a fever or persistent symptoms for more than 2-3 days. °· You have a fever and your symptoms suddenly get worse. °MAKE SURE YOU:  °· Understand these instructions. °· Will watch your condition. °· Will get help right away if you are not doing well or get worse. °  °This information is not intended to replace advice given to you by your health care provider. Make sure you discuss any questions you have with your health care provider. °  °Document Released: 03/25/2005 Document  Revised: 10/27/2011 Document Reviewed: 09/16/2011 °Elsevier Interactive Patient Education ©2016 [employer] Inc[employer] ° °Urinary Tract Infection °Urinary tract infections (UTIs) can develop anywhere along your urinary tract. Your urinary tract is your body's drainage system for removing wastes and extra water. Your urinary tract includes two kidneys, two ureters, a bladder, and a urethra. Your kidneys are a pair of bean-shaped organs. Each kidney is about the size of your fist. They are located below your ribs, one on each side of your spine. °CAUSES °Infections are caused by microbes, which are microscopic organisms, including fungi, viruses, and bacteria. These organisms are so small that they can only be seen through a microscope. Bacteria are the microbes that most commonly cause UTIs. °SYMPTOMS  °Symptoms of UTIs may vary by age and gender of the patient and by the location of the infection. Symptoms in young women typically include a frequent and intense urge to urinate and a painful, burning feeling in the bladder or urethra during urination. Older women and men are more likely to be tired, shaky, and weak and have muscle aches and abdominal pain. A fever may mean the infection is in your kidneys. Other symptoms of a kidney infection include pain in your back or sides below the ribs, nausea, and vomiting. °DIAGNOSIS °To diagnose a UTI, your caregiver will ask you about your symptoms. Your caregiver will also ask you to provide a urine sample. The   urine sample will be tested for bacteria and white blood cells. White blood cells are made by your body to help fight infection. °TREATMENT  °Typically, UTIs can be treated with medication. Because most UTIs are caused by a bacterial infection, they usually can be treated with the use of antibiotics. The choice of antibiotic and length of treatment depend on your symptoms and the type of bacteria causing your infection. °HOME CARE INSTRUCTIONS °· If you were prescribed  antibiotics, take them exactly as your caregiver instructs you. Finish the medication even if you feel better after you have only taken some of the medication. °· Drink enough water and fluids to keep your urine clear or pale yellow. °· Avoid caffeine, tea, and carbonated beverages. They tend to irritate your bladder. °· Empty your bladder often. Avoid holding urine for long periods of time. °· Empty your bladder before and after sexual intercourse. °· After a bowel movement, women should cleanse from front to back. Use each tissue only once. °SEEK MEDICAL CARE IF:  °· You have back pain. °· You develop a fever. °· Your symptoms do not begin to resolve within 3 days. °SEEK IMMEDIATE MEDICAL CARE IF:  °· You have severe back pain or lower abdominal pain. °· You develop chills. °· You have nausea or vomiting. °· You have continued burning or discomfort with urination. °MAKE SURE YOU:  °· Understand these instructions. °· Will watch your condition. °· Will get help right away if you are not doing well or get worse. °  °This information is not intended to replace advice given to you by your health care provider. Make sure you discuss any questions you have with your health care provider. °  °Document Released: 11/11/2004 Document Revised: 10/23/2014 Document Reviewed: 03/12/2011 °Elsevier Interactive Patient Education ©2016 Elsevier Inc. ° °

## 2015-11-10 LAB — OB RESULTS CONSOLE VARICELLA ZOSTER ANTIBODY, IGG: VARICELLA IGG: IMMUNE

## 2015-11-10 LAB — OB RESULTS CONSOLE RPR: RPR: NONREACTIVE

## 2015-11-10 LAB — OB RESULTS CONSOLE HIV ANTIBODY (ROUTINE TESTING): HIV: NONREACTIVE

## 2015-11-10 LAB — OB RESULTS CONSOLE HEPATITIS B SURFACE ANTIGEN: HEP B S AG: NEGATIVE

## 2015-11-10 LAB — OB RESULTS CONSOLE RUBELLA ANTIBODY, IGM: Rubella: IMMUNE

## 2015-11-11 ENCOUNTER — Other Ambulatory Visit: Payer: Self-pay | Admitting: Obstetrics and Gynecology

## 2015-11-11 DIAGNOSIS — Z369 Encounter for antenatal screening, unspecified: Secondary | ICD-10-CM

## 2015-11-25 ENCOUNTER — Emergency Department
Admission: EM | Admit: 2015-11-25 | Discharge: 2015-11-25 | Disposition: A | Discharge status: Home / Self Care | Payer: Medicaid Other | Attending: Emergency Medicine | Admitting: Emergency Medicine

## 2015-11-25 DIAGNOSIS — K047 Periapical abscess without sinus: Secondary | ICD-10-CM | POA: Insufficient documentation

## 2015-11-25 DIAGNOSIS — K0889 Other specified disorders of teeth and supporting structures: Secondary | ICD-10-CM

## 2015-11-25 MED ORDER — LIDOCAINE VISCOUS 2 % MT SOLN: 20.0000 mL | OROMUCOSAL | 0 refills | Status: DC | PRN

## 2015-11-25 MED ORDER — AMOXICILLIN 500 MG PO TABS: 500.0000 mg | ORAL_TABLET | Freq: Two times a day (BID) | ORAL | 0 refills | Status: DC

## 2015-11-25 NOTE — ED Provider Notes (Signed)
[facility] Emergency Department Provider Note   ____________________________________________   None    (approximate)  I have reviewed the triage vital signs and the nursing notes.   HISTORY  Chief Complaint Dental Pain    HPI Rebecca Francis is a 25 y.o. female presents for left upper gum pain times last several days. Patient states is progressively getting worse. There is no tooth in the area that the pain is the tooth has been pulled since previous visit last year. She describes her pain as 9 out of 10. No relief with just plain Tylenol.   No past medical history on file.  There are no active problems to display for this patient.   History reviewed. No pertinent surgical history.  Prior to Admission medications   Medication Sig Start Date End Date Taking? Authorizing Provider  amoxicillin (AMOXIL) 500 MG tablet Take 1 tablet (500 mg total) by mouth 2 (two) times daily. 11/25/15   Pierce Crane Aleece Loyd, PA-C  lidocaine (XYLOCAINE) 2 % solution Use as directed 20 mLs in the mouth or throat as needed for mouth pain. 11/25/15   Arlyss Repress, PA-C    Allergies Review of patient's allergies indicates no known allergies.  No family history on file.  Social History Social History  Substance Use Topics  . Smoking status: Never Smoker  . Smokeless tobacco: Never Used  . Alcohol use No    Review of Systems Constitutional: No fever/chills Eyes: No visual changes. ENT: No sore throat. Positive for left upper dental gum pain. Cardiovascular: Denies chest pain. Respiratory: Denies shortness of breath. Skin: Negative for rash. Neurological: Negative for headaches, focal weakness or numbness.  10-point ROS otherwise negative.  ____________________________________________   PHYSICAL EXAM:  VITAL SIGNS: ED Triage Vitals  Enc Vitals Group     BP 11/25/15 1108 119/63     Pulse Rate 11/25/15 1107 73     Resp 11/25/15 1107 17     Temp 11/25/15  1107 98.4 F (36.9 C)     Temp Source 11/25/15 1107 Oral     SpO2 11/25/15 1107 100 %     Weight 11/25/15 1108 182 lb (82.6 kg)     Height 11/25/15 1108 5\' 1"  (1.549 m)     Head Circumference --      Peak Flow --      Pain Score 11/25/15 1108 9     Pain Loc --      Pain Edu? --      Excl. in [place]? --     Constitutional: Alert and oriented. Well appearing and in no acute distress.Patient is currently 3 months pregnant. Mouth/Throat: Mucous membranes are moist.  Oropharynx non-erythematous.Obvious dental caries noted there is a tooth missing in the area of the pain but the gum itself is extremely erythematous. Visualization demonstrates poor dental care. Neck: No stridor.   Cardiovascular: Normal rate, regular rhythm. Grossly normal heart sounds.  Good peripheral circulation. Respiratory: Normal respiratory effort.  No retractions. Lungs CTAB. Neurologic:  Normal speech and language. No gross focal neurologic deficits are appreciated. No gait instability. Skin:  Skin is warm, dry and intact. No rash noted. Psychiatric: Mood and affect are normal. Speech and behavior are normal.  ____________________________________________   LABS (all labs ordered are listed, but only abnormal results are displayed)  Labs Reviewed - No data to display ____________________________________________  EKG   ____________________________________________  RADIOLOGY   ____________________________________________   PROCEDURES  Procedure(s) performed: None  Procedures  Critical Care  performed: No  ____________________________________________   INITIAL IMPRESSION / ASSESSMENT AND PLAN / ED COURSE  Pertinent labs & imaging results that were available during my care of the patient were reviewed by me and considered in my medical decision making (see chart for details).  Early dental abscess, erythema. Rx given for amoxicillin 500 mg 3 times a day, viscous lidocaine to swish and spit.  Encouraged continued use of Tylenol as needed for pain.  Clinical Course     ____________________________________________   FINAL CLINICAL IMPRESSION(S) / ED DIAGNOSES  Final diagnoses:  Pain, dental  Dental abscess      NEW MEDICATIONS STARTED DURING THIS VISIT:  Current Discharge Medication List       Note:  This document was prepared using Dragon voice recognition software and may include unintentional dictation errors.   Arlyss Repress, PA-C 11/25/15 1122    Lavonia Drafts, MD 11/25/15 [phone]

## 2015-11-25 NOTE — ED Triage Notes (Signed)
Pt c/o left upper tooth pain, states she has off and on pain for a while now.Marland Kitchen

## 2015-12-04 ENCOUNTER — Ambulatory Visit (HOSPITAL_BASED_OUTPATIENT_CLINIC_OR_DEPARTMENT_OTHER)
Admission: RE | Admit: 2015-12-04 | Discharge: 2015-12-04 | Disposition: A | Discharge status: Home / Self Care | Payer: Medicaid Other | Source: Ambulatory Visit | Attending: Maternal & Fetal Medicine | Admitting: Maternal & Fetal Medicine

## 2015-12-04 ENCOUNTER — Encounter: Payer: Self-pay | Admitting: *Deleted

## 2015-12-04 ENCOUNTER — Ambulatory Visit
Admission: RE | Admit: 2015-12-04 | Discharge: 2015-12-04 | Disposition: A | Discharge status: Home / Self Care | Payer: Medicaid Other | Source: Ambulatory Visit | Attending: Maternal & Fetal Medicine | Admitting: Maternal & Fetal Medicine

## 2015-12-04 DIAGNOSIS — Z369 Encounter for antenatal screening, unspecified: Secondary | ICD-10-CM | POA: Diagnosis not present

## 2015-12-04 DIAGNOSIS — E669 Obesity, unspecified: Secondary | ICD-10-CM

## 2015-12-04 DIAGNOSIS — O99212 Obesity complicating pregnancy, second trimester: Secondary | ICD-10-CM | POA: Insufficient documentation

## 2015-12-04 DIAGNOSIS — Z3A13 13 weeks gestation of pregnancy: Secondary | ICD-10-CM | POA: Diagnosis not present

## 2015-12-04 NOTE — Progress Notes (Signed)
Patient seen by me.  History reviewed.  I agree with the recommendations outlined in [place] note.

## 2015-12-04 NOTE — Progress Notes (Signed)
Referring physician:  [facility] Length of Consultation: 40 minutes   Rebecca Francis  was referred to [facility] for genetic counseling to review prenatal screening and testing options.  This note summarizes the information we discussed.    We offered the following routine screening tests for this pregnancy:  First trimester screening, which includes nuchal translucency ultrasound screen and first trimester maternal serum marker screening.  The nuchal translucency has approximately an 80% detection rate for Down syndrome and can be positive for other chromosome abnormalities as well as congenital heart defects.  When combined with a maternal serum marker screening, the detection rate is up to 90% for Down syndrome and up to 97% for trisomy 18.     Maternal serum marker screening, a blood test that measures pregnancy proteins, can provide risk assessments for Down syndrome, trisomy 18, and open neural tube defects (spina bifida, anencephaly). Because it does not directly examine the fetus, it cannot positively diagnose or rule out these problems.  Targeted ultrasound uses high frequency sound waves to create an image of the developing fetus.  An ultrasound is often recommended as a routine means of evaluating the pregnancy.  It is also used to screen for fetal anatomy problems (for example, a heart defect) that might be suggestive of a chromosomal or other abnormality.   Should these screening tests indicate an increased concern, then the following additional testing options would be offered:  The chorionic villus sampling procedure is available for first trimester chromosome analysis.  This involves the withdrawal of a small amount of chorionic villi (tissue from the developing placenta).  Risk of pregnancy loss is estimated to be approximately 1 in 200 to 1 in 100 (0.5 to 1%).  There is approximately a 1% (1 in 100) chance that the CVS chromosome results will be unclear.   Chorionic villi cannot be tested for neural tube defects.     Amniocentesis involves the removal of a small amount of amniotic fluid from the sac surrounding the fetus with the use of a thin needle inserted through the maternal abdomen and uterus.  Ultrasound guidance is used throughout the procedure.  Fetal cells from amniotic fluid are directly evaluated and > 99.5% of chromosome problems and > 98% of open neural tube defects can be detected. This procedure is generally performed after the 15th week of pregnancy.  The main risks to this procedure include complications leading to miscarriage in less than 1 in 200 cases (0.5%).  As another option for information if the pregnancy is suspected to be an an increased chance for certain chromosome conditions, we also reviewed the availability of cell free fetal DNA testing from maternal blood to determine whether or not the baby may have either Down syndrome, trisomy 1, or trisomy 16.  This test utilizes a maternal blood sample and DNA sequencing technology to isolate circulating cell free fetal DNA from maternal plasma.  The fetal DNA can then be analyzed for DNA sequences that are derived from the three most common chromosomes involved in aneuploidy, chromosomes 13, 18, and 21.  If the overall amount of DNA is greater than the expected level for any of these chromosomes, aneuploidy is suspected.  While we do not consider it a replacement for invasive testing and karyotype analysis, a negative result from this testing would be reassuring, though not a guarantee of a normal chromosome complement for the baby.  An abnormal result is certainly suggestive of an abnormal chromosome complement, though  we would still recommend CVS or amniocentesis to confirm any findings from this testing.  Cystic Fibrosis and Spinal Muscular Atrophy (SMA) screening were also discussed with the patient.  She is of Caucasian ancestry and her partner is Sales promotion account executive. Both conditions  are recessive, which means that both parents must be carriers in order to have a child with the disease.  Cystic fibrosis (CF) is one of the most common genetic conditions in persons of Caucasian ancestry.  This condition occurs in approximately 1 in 2,500 Caucasian persons and results in thickened secretions in the lungs, digestive, and reproductive systems.  For a baby to be at risk for having CF, both of the parents must be carriers for this condition.  Approximately 1 in 30 Caucasian persons is a carrier for CF.  Current carrier testing looks for the most common mutations in the gene for CF and can detect approximately 90% of carriers in the Caucasian population.  This means that the carrier screening can greatly reduce, but cannot eliminate, the chance for an individual to have a child with CF.  If an individual is found to be a carrier for CF, then carrier testing would be available for the partner. As part of [place] newborn screening profile, all babies born in the state of [place] will have a two-tier screening process.  Specimens are first tested to determine the concentration of immunoreactive trypsinogen (IRT).  The top 5% of specimens with the highest IRT values then undergo DNA testing using a panel of over 40 common CF mutations. SMA is a neurodegenerative disorder that leads to atrophy of skeletal muscle and overall weakness.  This condition is also more prevalent in the Caucasian population, with 1 in 40-1 in 60 persons being a carrier and 1 in 6,000-1 in 10,000 children being affected.  There are multiple forms of the disease, with some causing death in infancy to other forms with survival into adulthood.  The genetics of SMA is complex, but carrier screening can detect up to 95% of carriers in the Caucasian population.  Similar to CF, a negative result can greatly reduce, but cannot eliminate, the chance to have a child with SMA.  In addition, we offered hemoglobinopathy screening  because the father of the baby is reported to be African American.  Ms. Rozsa has a normal MCV (86), which reduces the chance for her to be a carrier for thalassemia.  We obtained a detailed family history and pregnancy history.  The patient reported one maternal half sister who was born with hand malformations due to amniotic bands in utero.  She is otherwise in good health, with normal physical and cognitive development.  We reviewed that malformations due to amniotic bands are the result of sporadic events in utero that would not be expected to increase the chance for this pregnancy to have a similar condition.  The remainder of the family history was reported to be unremarkable for birth defects, mental retardation, recurrent pregnancy loss or known chromosome abnormalities.  Ms. Salyards stated that this is her third pregnancy, the second with her current partner.  They have a healthy 13 year old daughter and she has a 74 year old son who is also in good health.   She reported no complications or exposures in this pregnancy that would be expected to increase the risk for birth defects.    After consideration of the options, Ms. Inboden elected to proceed with first trimester screening.  She declined carrier testing for  CF, SMA and hemoglobinopathies.  An ultrasound was performed at the time of the visit.  The gestational age was consistent with  14 weeks.  Fetal anatomy could not be assessed due to early gestational age.  Please refer to the ultrasound report for details of that study.  Ms. Rougier was encouraged to call with questions or concerns.  We can be contacted at [phone].    Wilburt Finlay, MS, CGC

## 2015-12-11 ENCOUNTER — Telehealth: Payer: Self-pay | Admitting: Obstetrics and Gynecology

## 2015-12-11 NOTE — Telephone Encounter (Signed)
  Ms. Jimmy elected to undergo First Trimester screening on 12/04/2015.  To review, first trimester screening, includes nuchal translucency ultrasound screen and/or first trimester maternal serum marker screening.  The nuchal translucency has approximately an 80% detection rate for Down syndrome and can be positive for other chromosome abnormalities as well as heart defects.  When combined with a maternal serum marker screening, the detection rate is up to 90% for Down syndrome and up to 97% for trisomy 13 and 18.     The results of the First Trimester Nuchal Translucency and Biochemical Screening were within normal range.  The risk for Down syndrome is now estimated to be less than 1 in 10,000.  The risk for Trisomy 13/18 is also estimated to be less than 1 in 10,000.  Should more definitive information be desired, we would offer amniocentesis.  Because we do not yet know the effectiveness of combined first and second trimester screening, we do not recommend a maternal serum screen to assess the chance for chromosome conditions.  However, if screening for neural tube defects is desired, maternal serum screening for AFP only can be performed between 15 and [redacted] weeks gestation.      Wilburt Finlay, MS, CGC

## 2016-01-01 ENCOUNTER — Other Ambulatory Visit: Payer: Self-pay | Admitting: *Deleted

## 2016-01-01 DIAGNOSIS — O0001 Abdominal pregnancy with intrauterine pregnancy: Secondary | ICD-10-CM

## 2016-01-05 ENCOUNTER — Ambulatory Visit
Admission: RE | Admit: 2016-01-05 | Discharge: 2016-01-05 | Disposition: A | Discharge status: Home / Self Care | Payer: Medicaid Other | Source: Ambulatory Visit | Attending: Maternal & Fetal Medicine | Admitting: Maternal & Fetal Medicine

## 2016-01-05 DIAGNOSIS — Z369 Encounter for antenatal screening, unspecified: Secondary | ICD-10-CM

## 2016-01-05 DIAGNOSIS — O0001 Abdominal pregnancy with intrauterine pregnancy: Secondary | ICD-10-CM | POA: Insufficient documentation

## 2016-01-05 DIAGNOSIS — Z3A17 17 weeks gestation of pregnancy: Secondary | ICD-10-CM | POA: Diagnosis not present

## 2016-02-16 NOTE — L&D Delivery Note (Signed)
Delivery Note At 2:25 AM a viable and healthy female "Rebecca Francis" was delivered via Vaginal, Spontaneous Delivery (Presentation:OA.  APGAR: 9,9 ; weight  .   Placenta status: spontaneous  Cord: 3vc with the following complications: none  Anesthesia:  epidural Episiotomy: None Lacerations:  none Est. Blood Loss (mL):  100 Mom to postpartum.  Baby to Couplet care / Skin to Skin.  Benjaman Kindler 05/19/2016, 2:37 AM  Delivery Note  First Stage: Labor onset: 1300 Augmentation : Pitocin Analgesia /Anesthesia intrapartum: EPidural PPROM at 1300  Second Stage: Complete dilation at 0210 Onset of pushing at 0210 FHR second stage 0225   Cord double clamped after cessation of pulsation, cut by grandmother Cord blood sample collected for Rh negative   Third Stage: Placenta delivered  intact with 3 VC @ 0229 Placenta disposition: normal Uterine tone firm / bleeding minimal  no laceration identified  Est. Blood Loss (mL): 496  Complications: none  Newborn: Birth Weight: pending  Apgar Scores: 9/9 Feeding planned: bottle  Of note, chlamydia was diagnosed on admission. Antibiotics ordered in postpartum.

## 2016-05-13 LAB — OB RESULTS CONSOLE GBS: GBS: POSITIVE

## 2016-05-17 ENCOUNTER — Observation Stay
Admission: EM | Admit: 2016-05-17 | Discharge: 2016-05-17 | Disposition: A | Discharge status: Home / Self Care | Payer: Commercial Managed Care - PPO | Source: Home / Self Care | Admitting: Obstetrics and Gynecology

## 2016-05-17 DIAGNOSIS — Z3490 Encounter for supervision of normal pregnancy, unspecified, unspecified trimester: Secondary | ICD-10-CM

## 2016-05-17 DIAGNOSIS — M545 Low back pain: Secondary | ICD-10-CM

## 2016-05-17 DIAGNOSIS — O26893 Other specified pregnancy related conditions, third trimester: Secondary | ICD-10-CM | POA: Insufficient documentation

## 2016-05-17 DIAGNOSIS — Z3A36 36 weeks gestation of pregnancy: Secondary | ICD-10-CM

## 2016-05-17 DIAGNOSIS — Z369 Encounter for antenatal screening, unspecified: Secondary | ICD-10-CM

## 2016-05-17 NOTE — Discharge Summary (Signed)
  Boykin Nearing, MD  Obstetrics    [] Hide copied text [] Hover for attribution information Patient ID: Rebecca Francis, female   DOB: 23-Feb-1990, 26 y.o.   MRN: [id number] Rebecca Francis 1990/03/13 G3 P0 [redacted]w[redacted]d presents for LBP  noLOF , no vaginal bleeding , O;BP 119/74 (BP Location: Left Arm)   Pulse 99   Temp 98.2 F (36.8 C) (Oral)   Resp 18   LMP 09/03/2015   CX closed by RN  NST reactive  Uterine irritability  Labs: none A: No labor , reassuring fetal monitoring  P:precautions given by RN     Electronically signed by Boykin Nearing, MD at 05/17/2016 7:34 AM

## 2016-05-17 NOTE — Progress Notes (Signed)
Patient ID: Rebecca Francis, female   DOB: 01-15-1991, 26 y.o.   MRN: [id number] Rebecca Francis 16-Jul-1990 G3 P0 [redacted]w[redacted]d presents for LBP  noLOF , no vaginal bleeding , O;BP 119/74 (BP Location: Left Arm)   Pulse 99   Temp 98.2 F (36.8 C) (Oral)   Resp 18   LMP 09/03/2015   CX closed by RN  NST reactive  Uterine irritability  Labs: none A: No labor , reassuring fetal monitoring  P:precautions given by RN

## 2016-05-17 NOTE — OB Triage Note (Signed)
Patient main complaint of mid to lower back pain that began around 2000. Pain is intermittent rating pain 3/4 out of 10. Patient c/o of pelvic pressure that has been going on for 3 weeks. Denies vaginal bleeding and vaginal discharge. Denies decrease fetal movement. Patient on the fetal monitor and will continue to assess.

## 2016-05-17 NOTE — Progress Notes (Signed)
Dr. Ouida Sills notified via phone of patient report/status/and vaginal exam. Notified of patient complaint of intermit. lower back pain. Vaginal exam was unchanged since her last appointment on Thursday. Notified of cat. I strip and uterine irritability. No contractions noted. Verbal orders to discharge patient. Patient notified of discharge/plan of care. Patient agrees and verbalizes understanding to plan of care.

## 2016-05-18 ENCOUNTER — Inpatient Hospital Stay
Admission: EM | Admit: 2016-05-18 | Discharge: 2016-05-20 | DRG: 774 | Disposition: A | Discharge status: Home / Self Care | Payer: Commercial Managed Care - PPO | Attending: Obstetrics and Gynecology | Admitting: Obstetrics and Gynecology

## 2016-05-18 ENCOUNTER — Encounter: Payer: Self-pay | Admitting: *Deleted

## 2016-05-18 DIAGNOSIS — Z3A36 36 weeks gestation of pregnancy: Secondary | ICD-10-CM | POA: Diagnosis not present

## 2016-05-18 DIAGNOSIS — Z6791 Unspecified blood type, Rh negative: Secondary | ICD-10-CM

## 2016-05-18 DIAGNOSIS — O99824 Streptococcus B carrier state complicating childbirth: Secondary | ICD-10-CM | POA: Diagnosis present

## 2016-05-18 DIAGNOSIS — Z369 Encounter for antenatal screening, unspecified: Secondary | ICD-10-CM

## 2016-05-18 DIAGNOSIS — A568 Sexually transmitted chlamydial infection of other sites: Secondary | ICD-10-CM | POA: Diagnosis present

## 2016-05-18 DIAGNOSIS — O26893 Other specified pregnancy related conditions, third trimester: Secondary | ICD-10-CM | POA: Diagnosis present

## 2016-05-18 DIAGNOSIS — O42013 Preterm premature rupture of membranes, onset of labor within 24 hours of rupture, third trimester: Principal | ICD-10-CM | POA: Diagnosis present

## 2016-05-18 DIAGNOSIS — O9832 Other infections with a predominantly sexual mode of transmission complicating childbirth: Secondary | ICD-10-CM | POA: Diagnosis present

## 2016-05-18 DIAGNOSIS — O4290 Premature rupture of membranes, unspecified as to length of time between rupture and onset of labor, unspecified weeks of gestation: Secondary | ICD-10-CM

## 2016-05-18 DIAGNOSIS — O42913 Preterm premature rupture of membranes, unspecified as to length of time between rupture and onset of labor, third trimester: Secondary | ICD-10-CM | POA: Diagnosis present

## 2016-05-18 LAB — CBC
HCT: 31.2 % — ABNORMAL LOW (ref 35.0–47.0)
Hemoglobin: 10 g/dL — ABNORMAL LOW (ref 12.0–16.0)
MCH: 25.9 pg — ABNORMAL LOW (ref 26.0–34.0)
MCHC: 32.1 g/dL (ref 32.0–36.0)
MCV: 80.5 fL (ref 80.0–100.0)
Platelets: 210 10*3/uL (ref 150–440)
RBC: 3.87 MIL/uL (ref 3.80–5.20)
RDW: 15.2 % — AB (ref 11.5–14.5)
WBC: 16.6 10*3/uL — AB (ref 3.6–11.0)

## 2016-05-18 LAB — TYPE AND SCREEN
ABO/RH(D): O NEG
ANTIBODY SCREEN: NEGATIVE

## 2016-05-18 LAB — RAPID HIV SCREEN (HIV 1/2 AB+AG)
HIV 1/2 Antibodies: NONREACTIVE
HIV-1 P24 Antigen - HIV24: NONREACTIVE

## 2016-05-18 LAB — CHLAMYDIA/NGC RT PCR (ARMC ONLY)
Chlamydia Tr: DETECTED — AB
N GONORRHOEAE: NOT DETECTED

## 2016-05-18 MED ORDER — OXYTOCIN 40 UNITS IN LACTATED RINGERS INFUSION - SIMPLE MED
1.0000 m[IU]/min | INTRAVENOUS | Status: DC
  Administered 2016-05-18: 1 m[IU]/min via INTRAVENOUS
  Filled 2016-05-18: qty 1000

## 2016-05-18 MED ORDER — LIDOCAINE HCL (PF) 1 % IJ SOLN: 30.0000 mL | INTRAMUSCULAR | Status: DC | PRN

## 2016-05-18 MED ORDER — SOD CITRATE-CITRIC ACID 500-334 MG/5ML PO SOLN
30.0000 mL | ORAL | Status: DC | PRN
  Filled 2016-05-18: qty 30

## 2016-05-18 MED ORDER — LIDOCAINE HCL (PF) 1 % IJ SOLN
INTRAMUSCULAR | Status: DC
Start: 2016-05-18 — End: 2016-05-19
  Filled 2016-05-18: qty 30

## 2016-05-18 MED ORDER — ONDANSETRON HCL 4 MG/2ML IJ SOLN
4.0000 mg | Freq: Four times a day (QID) | INTRAMUSCULAR | Status: DC | PRN
Start: 2016-05-18 — End: 2016-05-19

## 2016-05-18 MED ORDER — LACTATED RINGERS IV SOLN: 500.0000 mL | INTRAVENOUS | Status: DC | PRN

## 2016-05-18 MED ORDER — SODIUM CHLORIDE 0.9 % IV SOLN
1.0000 g | INTRAVENOUS | Status: DC
  Administered 2016-05-18: 1 g via INTRAVENOUS
  Filled 2016-05-18 (×3): qty 1000

## 2016-05-18 MED ORDER — SODIUM CHLORIDE 0.9 % IV SOLN
2.0000 g | Freq: Once | INTRAVENOUS | Status: AC
  Administered 2016-05-18: 2 g via INTRAVENOUS
  Filled 2016-05-18: qty 2000

## 2016-05-18 MED ORDER — LACTATED RINGERS IV SOLN
INTRAVENOUS | Status: DC
  Administered 2016-05-18: 1000 mL via INTRAVENOUS

## 2016-05-18 MED ORDER — MISOPROSTOL 200 MCG PO TABS
ORAL_TABLET | ORAL | Status: AC
  Filled 2016-05-18: qty 4

## 2016-05-18 MED ORDER — OXYTOCIN BOLUS FROM INFUSION
500.0000 mL | Freq: Once | INTRAVENOUS | Status: AC
  Administered 2016-05-19: 500 mL via INTRAVENOUS

## 2016-05-18 MED ORDER — OXYCODONE-ACETAMINOPHEN 5-325 MG PO TABS: 2.0000 | ORAL_TABLET | ORAL | Status: DC | PRN

## 2016-05-18 MED ORDER — ACETAMINOPHEN 325 MG PO TABS
650.0000 mg | ORAL_TABLET | ORAL | Status: DC | PRN
  Administered 2016-05-18: 650 mg via ORAL

## 2016-05-18 MED ORDER — OXYTOCIN 40 UNITS IN LACTATED RINGERS INFUSION - SIMPLE MED
2.5000 [IU]/h | INTRAVENOUS | Status: DC
  Filled 2016-05-18: qty 1000

## 2016-05-18 MED ORDER — ACETAMINOPHEN 325 MG PO TABS
ORAL_TABLET | ORAL | Status: AC
  Administered 2016-05-18: 650 mg via ORAL
  Filled 2016-05-18: qty 2

## 2016-05-18 MED ORDER — OXYCODONE-ACETAMINOPHEN 5-325 MG PO TABS: 1.0000 | ORAL_TABLET | ORAL | Status: DC | PRN

## 2016-05-18 MED ORDER — AMMONIA AROMATIC IN INHA
RESPIRATORY_TRACT | Status: AC
  Filled 2016-05-18: qty 10

## 2016-05-18 MED ORDER — BUTORPHANOL TARTRATE 1 MG/ML IJ SOLN: 1.0000 mg | INTRAMUSCULAR | Status: DC | PRN

## 2016-05-18 MED ORDER — TERBUTALINE SULFATE 1 MG/ML IJ SOLN: 0.2500 mg | Freq: Once | INTRAMUSCULAR | Status: DC | PRN

## 2016-05-18 MED ORDER — OXYTOCIN 10 UNIT/ML IJ SOLN
INTRAMUSCULAR | Status: AC
  Filled 2016-05-18: qty 2

## 2016-05-18 NOTE — OB Triage Note (Signed)
G3P2 presents at [redacted]w[redacted]d w/ c/o ctx since 1300 today. Has also noticed spotting and LOF since 1300. Noticed a pink/clear discharge. Has not been feeling baby move as much.

## 2016-05-18 NOTE — Plan of Care (Signed)
Problem: Education: Goal: Ability to make informed decisions regarding treatment and plan of care will improve Outcome: Progressing Pt admitted for induction/augmentation of labor for PPROM. Education on the use of pitocin, including IV administration, titration and what to expect for pain management.   Problem: Life Cycle: Goal: Ability to make normal progression through stages of labor will improve Outcome: Progressing Pt not contracting despite ruptured membranes. Educated on the risks of prolonged rupture of membranes. Verbalized understanding.

## 2016-05-18 NOTE — H&P (Addendum)
OB ADMISSION/ HISTORY & PHYSICAL:  Admission Date: 05/18/2016  2:23 PM  Admit Diagnosis: Premature rupture of membranes  Rebecca Francis is a 26 y.o. female G3P2002 at 74+6wks presenting for PPROM with clear, copious leaking of fluid. Good fetal movement.   Prenatal History: G3P2   EDC : 06/09/2016 Prenatal care at [facility] Provider: Jefm Bryant Prenatal course complicated by hx of PTD at 36wks, chlamydia dx and treated with azithromycin 3 days ago, RH neg  Prenatal Labs: MBT O Neg Ab screen Negative Pap Negative HIV Negative Hep B/RPR Negative/Non reactive  Rubella Immune VZV Immune  GTT: 125 GBS:    positive  Medical / Surgical History :  Past medical history: History reviewed. No pertinent past medical history.   Past surgical history: History reviewed. No pertinent surgical history.  Family History: History reviewed. No pertinent family history.   Social History:  reports that she has never smoked. She has never used smokeless tobacco. She reports that she does not drink alcohol or use drugs.   Allergies: Patient has no known allergies.    Current Medications at time of admission:  Prior to Admission medications   Medication Sig Start Date End Date Taking? Authorizing Provider  Prenatal Vit-Fe Fumarate-FA (MULTIVITAMIN-PRENATAL) 27-0.8 MG TABS tablet Take 1 tablet by mouth daily at 12 noon.    Historical Provider, MD     Review of Systems: Active FM onset of ctx @ 1:00pm currently irregular LOF  / SROM @ 1:00pm bloody show yes   Physical Exam:  VS: Blood pressure 121/79, pulse 91, temperature 98.1 F (36.7 C), temperature source Oral, resp. rate 20, last menstrual period 09/03/2015.  General: alert and oriented, appears NAD Heart: RRR Lungs: Clear lung fields Abdomen: Gravid, soft and non-tender, non-distended  Extremities: +1 edema  Genitalia / VE: Dilation: 3 Effacement (%): 70, 80 Station: -1 Exam by:: J. Grindheim  FHR: baseline rate 130/  variability moderate / accelerations + / no decelerations TOCO: none  Last [place]  01/05/16 [place] at [place]: Anatomy WNL at 17 weeks(due to elevated maternal BMI), AFI WNL. Post placenta  Assessment: 36+[redacted] weeks gestation First stage of labor FHR category I   Plan:  Start tx for GBS and then start pitocin  Admit for active labor Labs pending Epidural when desired Continuous fetal monitoring   1. Fetal Well being  - Fetal Tracing: Cat I - Ultrasound:  reviewed, as above - Group B Streptococcus: positive  2. Routine OB: - Prenatal labs reviewed, as above - Rh negative  3. Induction of Labor:  -  Contractions external toco in place -  Pelvis proven to 7lbs -  Plan for induction with pitocin  4. Post Partum Planning:  Contraception Plan: signed BTL papers 02/27/16

## 2016-05-18 NOTE — Progress Notes (Signed)
Rebecca Francis is a 26 y.o. G3P2 at [redacted]w[redacted]d  Subjective: Feeling contractions more strongly  Objective: BP 121/79   Pulse 96   Temp 97.4 F (36.3 C) (Oral)   Resp 20   Ht 5\' 3"  (1.6 m)   Wt 187 lb (84.8 kg)   LMP 09/03/2015   BMI 33.13 kg/m  I/O last 3 completed shifts: In: 260.4 [I.V.:260.4] Out: -  Total I/O In: 630.1 [P.O.:120; I.V.:510.1] Out: -   FHT:  FHR: 140 bpm, variability: moderate,  accelerations:  Present,  decelerations:  Absent UC:   regular, every 3-4 minutes SVE:   Dilation: 4 Effacement (%): 80 Station: -1 Exam by:: AAmalia Hailey, RN  Labs: Lab Results  Component Value Date   WBC 16.6 (H) 05/18/2016   HGB 10.0 (L) 05/18/2016   HCT 31.2 (L) 05/18/2016   MCV 80.5 05/18/2016   PLT 210 05/18/2016    Assessment / Plan:  IOL for PPROM, on a pit of 7. Contractions now regular. Will continue, minimal checking unless clinical concern. Anticipate NSVD  Benjaman Kindler 05/18/2016, 11:17 PM

## 2016-05-18 NOTE — Progress Notes (Signed)
Rebecca Francis is a 26 y.o. G3P2 at [redacted]w[redacted]d  Subjective: Feeling well  Objective: BP 116/73   Pulse 89   Temp 98.3 F (36.8 C)   Resp 20   Ht 5\' 3"  (1.6 m)   Wt 187 lb (84.8 kg)   LMP 09/03/2015   BMI 33.13 kg/m  No intake/output data recorded. No intake/output data recorded.  FHT:  FHR: 140 bpm, variability: moderate,  accelerations:  Present,  decelerations:  Absent UC:   irregular SVE:   Dilation: 3 Effacement (%): 70, 80 Station: -1 Exam by:: J. Grindheim  Labs: Lab Results  Component Value Date   WBC 16.6 (H) 05/18/2016   HGB 10.0 (L) 05/18/2016   HCT 31.2 (L) 05/18/2016   MCV 80.5 05/18/2016   PLT 210 05/18/2016    Assessment / Plan:  Continue amp for GBS ppx Start pitocin 4hrs after Anticipate vaginal delivery Discussed BTL with patient. If unsure at all, opt for LARCs. Pt's family seem to want her not to [place], but she thinks she is sure. I assured her that this is her choice to make and we will abide by her wishes. DIscussed postpartum BTL surgery.  Benjaman Kindler 05/18/2016, 5:51 PM

## 2016-05-19 ENCOUNTER — Inpatient Hospital Stay: Payer: Commercial Managed Care - PPO | Admitting: Anesthesiology

## 2016-05-19 LAB — CBC
HCT: 29.4 % — ABNORMAL LOW (ref 35.0–47.0)
HEMOGLOBIN: 9.8 g/dL — AB (ref 12.0–16.0)
MCH: 26.3 pg (ref 26.0–34.0)
MCHC: 33.4 g/dL (ref 32.0–36.0)
MCV: 78.7 fL — ABNORMAL LOW (ref 80.0–100.0)
Platelets: 201 10*3/uL (ref 150–440)
RBC: 3.74 MIL/uL — AB (ref 3.80–5.20)
RDW: 14.8 % — ABNORMAL HIGH (ref 11.5–14.5)
WBC: 18.8 10*3/uL — ABNORMAL HIGH (ref 3.6–11.0)

## 2016-05-19 LAB — RPR: RPR: NONREACTIVE

## 2016-05-19 LAB — FETAL SCREEN: Fetal Screen: NEGATIVE

## 2016-05-19 MED ORDER — DIPHENHYDRAMINE HCL 25 MG PO CAPS: 25.0000 mg | ORAL_CAPSULE | Freq: Four times a day (QID) | ORAL | Status: DC | PRN

## 2016-05-19 MED ORDER — LIDOCAINE-EPINEPHRINE (PF) 1.5 %-1:200000 IJ SOLN
INTRAMUSCULAR | Status: DC | PRN
  Administered 2016-05-19: 3 mL

## 2016-05-19 MED ORDER — COCONUT OIL OIL: 1.0000 "application " | TOPICAL_OIL | Status: DC | PRN

## 2016-05-19 MED ORDER — FENTANYL 2.5 MCG/ML W/ROPIVACAINE 0.2% IN NS 100 ML EPIDURAL INFUSION (ARMC-ANES): 10.0000 mL/h | EPIDURAL | Status: DC

## 2016-05-19 MED ORDER — NALBUPHINE HCL 10 MG/ML IJ SOLN: 5.0000 mg | INTRAMUSCULAR | Status: DC | PRN

## 2016-05-19 MED ORDER — RHO D IMMUNE GLOBULIN 1500 UNIT/2ML IJ SOSY
300.0000 ug | PREFILLED_SYRINGE | Freq: Once | INTRAMUSCULAR | Status: AC
Start: 2016-05-19 — End: 2016-05-19
  Administered 2016-05-19: 300 ug via INTRAVENOUS
  Filled 2016-05-19: qty 2

## 2016-05-19 MED ORDER — FLEET ENEMA 7-19 GM/118ML RE ENEM: 1.0000 | ENEMA | Freq: Every day | RECTAL | Status: DC | PRN

## 2016-05-19 MED ORDER — NALOXONE HCL 2 MG/2ML IJ SOSY: 1.0000 ug/kg/h | PREFILLED_SYRINGE | INTRAVENOUS | Status: DC | PRN

## 2016-05-19 MED ORDER — PRENATAL MULTIVITAMIN CH
1.0000 | ORAL_TABLET | Freq: Every day | ORAL | Status: DC
  Administered 2016-05-19 – 2016-05-20 (×2): 1 via ORAL
  Filled 2016-05-19 (×2): qty 1

## 2016-05-19 MED ORDER — SODIUM CHLORIDE 0.9 % IV SOLN: 250.0000 mL | INTRAVENOUS | Status: DC | PRN

## 2016-05-19 MED ORDER — MEASLES, MUMPS & RUBELLA VAC ~~LOC~~ INJ: 0.5000 mL | INJECTION | Freq: Once | SUBCUTANEOUS | Status: DC

## 2016-05-19 MED ORDER — FENTANYL 2.5 MCG/ML W/ROPIVACAINE 0.2% IN NS 100 ML EPIDURAL INFUSION (ARMC-ANES)
EPIDURAL | Status: DC | PRN
  Administered 2016-05-19: 10 mL/h via EPIDURAL

## 2016-05-19 MED ORDER — ACETAMINOPHEN 325 MG PO TABS: 650.0000 mg | ORAL_TABLET | ORAL | Status: DC | PRN

## 2016-05-19 MED ORDER — ONDANSETRON HCL 4 MG/2ML IJ SOLN: 4.0000 mg | Freq: Three times a day (TID) | INTRAMUSCULAR | Status: DC | PRN

## 2016-05-19 MED ORDER — DIPHENHYDRAMINE HCL 50 MG/ML IJ SOLN: 12.5000 mg | INTRAMUSCULAR | Status: DC | PRN

## 2016-05-19 MED ORDER — IBUPROFEN 600 MG PO TABS
600.0000 mg | ORAL_TABLET | Freq: Four times a day (QID) | ORAL | Status: DC
  Administered 2016-05-19 – 2016-05-20 (×6): 600 mg via ORAL
  Filled 2016-05-19 (×6): qty 1

## 2016-05-19 MED ORDER — ONDANSETRON HCL 4 MG PO TABS: 4.0000 mg | ORAL_TABLET | ORAL | Status: DC | PRN

## 2016-05-19 MED ORDER — DIBUCAINE 1 % RE OINT: 1.0000 "application " | TOPICAL_OINTMENT | RECTAL | Status: DC | PRN

## 2016-05-19 MED ORDER — BISACODYL 10 MG RE SUPP
10.0000 mg | Freq: Every day | RECTAL | Status: DC | PRN
Start: 2016-05-19 — End: 2016-05-20

## 2016-05-19 MED ORDER — SODIUM CHLORIDE 0.9% FLUSH: 3.0000 mL | Freq: Two times a day (BID) | INTRAVENOUS | Status: DC

## 2016-05-19 MED ORDER — FENTANYL 2.5 MCG/ML W/ROPIVACAINE 0.2% IN NS 100 ML EPIDURAL INFUSION (ARMC-ANES)
EPIDURAL | Status: AC
  Filled 2016-05-19: qty 100

## 2016-05-19 MED ORDER — BENZOCAINE-MENTHOL 20-0.5 % EX AERO: 1.0000 "application " | INHALATION_SPRAY | CUTANEOUS | Status: DC | PRN

## 2016-05-19 MED ORDER — SIMETHICONE 80 MG PO CHEW: 80.0000 mg | CHEWABLE_TABLET | ORAL | Status: DC | PRN

## 2016-05-19 MED ORDER — NALBUPHINE HCL 10 MG/ML IJ SOLN: 5.0000 mg | Freq: Once | INTRAMUSCULAR | Status: DC | PRN

## 2016-05-19 MED ORDER — ZOLPIDEM TARTRATE 5 MG PO TABS: 5.0000 mg | ORAL_TABLET | Freq: Every evening | ORAL | Status: DC | PRN

## 2016-05-19 MED ORDER — CEFTRIAXONE SODIUM 250 MG IJ SOLR
250.0000 mg | Freq: Once | INTRAMUSCULAR | Status: AC
  Administered 2016-05-19: 250 mg via INTRAMUSCULAR
  Filled 2016-05-19 (×3): qty 250

## 2016-05-19 MED ORDER — SODIUM CHLORIDE 0.9% FLUSH: 3.0000 mL | INTRAVENOUS | Status: DC | PRN

## 2016-05-19 MED ORDER — ONDANSETRON HCL 4 MG/2ML IJ SOLN: 4.0000 mg | INTRAMUSCULAR | Status: DC | PRN

## 2016-05-19 MED ORDER — TETANUS-DIPHTH-ACELL PERTUSSIS 5-2.5-18.5 LF-MCG/0.5 IM SUSP: 0.5000 mL | Freq: Once | INTRAMUSCULAR | Status: DC

## 2016-05-19 MED ORDER — DIPHENHYDRAMINE HCL 25 MG PO CAPS: 25.0000 mg | ORAL_CAPSULE | ORAL | Status: DC | PRN

## 2016-05-19 MED ORDER — SENNOSIDES-DOCUSATE SODIUM 8.6-50 MG PO TABS
2.0000 | ORAL_TABLET | ORAL | Status: DC
  Administered 2016-05-19: 2 via ORAL
  Filled 2016-05-19: qty 2

## 2016-05-19 MED ORDER — WITCH HAZEL-GLYCERIN EX PADS: 1.0000 "application " | MEDICATED_PAD | CUTANEOUS | Status: DC | PRN

## 2016-05-19 MED ORDER — SODIUM CHLORIDE 0.9 % IV SOLN
INTRAVENOUS | Status: DC | PRN
  Administered 2016-05-19 (×3): 5 mL via EPIDURAL

## 2016-05-19 MED ORDER — OXYCODONE HCL 5 MG PO TABS: 5.0000 mg | ORAL_TABLET | ORAL | Status: DC | PRN

## 2016-05-19 MED ORDER — NALOXONE HCL 0.4 MG/ML IJ SOLN: 0.4000 mg | INTRAMUSCULAR | Status: DC | PRN

## 2016-05-19 MED ORDER — AZITHROMYCIN 1 G PO PACK
1.0000 g | PACK | Freq: Once | ORAL | Status: DC
  Filled 2016-05-19: qty 1

## 2016-05-19 NOTE — Progress Notes (Signed)
Post Partum Day 1 Subjective: no complaints, up ad lib, voiding and tolerating PO  Objective: Blood pressure (!) 104/59, pulse 71, temperature 98.2 F (36.8 C), resp. rate 17, height 5\' 3"  (1.6 m), weight 84.8 kg (187 lb), last menstrual period 09/03/2015, SpO2 100 %.  Physical Exam:  General: alert, cooperative and appears stated age  Heart: S1S2, No M/R/G. Lungs:CTA bilat, no W/R/R. Abd:U-3 Lochia: mod, no clots  Uterine Fundus: firm Incision: healing well DVT Evaluation: No evidence of DVT seen on physical exam.   Recent Labs  05/18/16 1640 05/19/16 0532  HGB 10.0* 9.8*  HCT 31.2* 29.4*    Assessment/Plan: Plan for discharge tomorrow   LOS: 1 day   Catheryn Bacon 05/19/2016, 9:07 AM

## 2016-05-19 NOTE — Anesthesia Preprocedure Evaluation (Addendum)
Anesthesia Evaluation  Patient identified by MRN, date of birth, ID band Patient awake    Reviewed: Allergy & Precautions, NPO status , Patient's Chart, lab work & pertinent test results  History of Anesthesia Complications Negative for: history of anesthetic complications  Airway Mallampati: II  TM Distance: >3 FB Neck ROM: Full    Dental no notable dental hx.    Pulmonary neg pulmonary ROS, neg sleep apnea, neg COPD,    breath sounds clear to auscultation- rhonchi (-) wheezing      Cardiovascular Exercise Tolerance: Good (-) hypertension(-) CAD and (-) Past MI  Rhythm:Regular Rate:Normal - Systolic murmurs and - Diastolic murmurs    Neuro/Psych negative neurological ROS  negative psych ROS   GI/Hepatic negative GI ROS, Neg liver ROS,   Endo/Other  negative endocrine ROSneg diabetes  Renal/GU negative Renal ROS     Musculoskeletal negative musculoskeletal ROS (+)   Abdominal (+) + obese, Gravid abdomen  Peds  Hematology negative hematology ROS (+)   Anesthesia Other Findings   Reproductive/Obstetrics (+) Pregnancy                             Anesthesia Physical Anesthesia Plan  ASA: II  Anesthesia Plan: Epidural   Post-op Pain Management:    Induction:   Airway Management Planned:   Additional Equipment:   Intra-op Plan:   Post-operative Plan:   Informed Consent: I have reviewed the patients History and Physical, chart, labs and discussed the procedure including the risks, benefits and alternatives for the proposed anesthesia with the patient or authorized representative who has indicated his/her understanding and acceptance.     Plan Discussed with: Anesthesiologist  Anesthesia Plan Comments: (Plan for epidural for labor, discussed epidural vs spinal vs GA if need for csection)        Lab Results  Component Value Date   WBC 16.6 (H) 05/18/2016   HGB 10.0 (L)  05/18/2016   HCT 31.2 (L) 05/18/2016   MCV 80.5 05/18/2016   PLT 210 05/18/2016    Anesthesia Quick Evaluation

## 2016-05-19 NOTE — Discharge Summary (Signed)
Obstetrical Discharge Summary  Patient Name: Rebecca Francis DOB: 1990/04/14 MRN: [id number]  Date of Admission: 05/18/2016 Date of Discharge: 05/20/2016  Primary OB: [facility]  Gestational Age at Delivery: [redacted]w[redacted]d   Antepartum complications: Hx of PTD, Rh neg, Chlamydia in pregnancy and treated just this week- no neg test of cure Admitting Diagnosis: Preterm premature rupture of membranes Secondary Diagnosis: Patient Active Problem List   Diagnosis Date Noted  . Premature rupture of membranes (PROM) affecting third pregnancy 05/18/2016  . Pregnancy 05/17/2016  . First trimester screening 12/04/2015    Augmentation: Pitocin Complications: None Intrapartum complications/course: Pt admitted with PPROM and contractions. Given amp for GBS ppx and pitocin started. She progressed quickly to fully dilated and delivered over intact perineum easily. Placenta delivered spontaneously and intact. Bleeding minimal, no lacerations.  Chlamydia dx on admission, but she had just been treated this week with azithromycin. Coverage completed with ceftriaxone in postpartum area.  Date of Delivery: 05/19/16 Delivered By: Benjaman Kindler Delivery Type: spontaneous vaginal delivery Anesthesia: epidural Placenta: Spontaneous Laceration: none Episiotomy: none Newborn Data: Live born female "Nassir" Birth Weight:   APGAR: 9,9     Discharge Physical Exam:  BP 132/64 (BP Location: Left Arm)   Pulse 78   Temp 97.5 F (36.4 C) (Oral)   Resp 18   Ht 5\' 3"  (1.6 m)   Wt 84.8 kg (187 lb)   LMP 09/03/2015   SpO2 100%   BMI 33.13 kg/m   General: NAD CV: RRR Pulm: CTABL, nl effort ABD: s/nd/nt, fundus firm and below the umbilicus Lochia: moderate DVT Evaluation: LE non-ttp, no evidence of DVT on exam.  Hemoglobin  Date Value Ref Range Status  05/19/2016 9.8 (L) 12.0 - 16.0 g/dL Final   HGB  Date Value Ref Range Status  05/03/2012 9.5 (L) 12.0 - 16.0 g/dL Final   HCT  Date Value Ref  Range Status  05/19/2016 29.4 (L) 35.0 - 47.0 % Final  05/04/2012 28.8 (L) 35.0 - 47.0 % Final    Post partum course: routine, uncomplicated Postpartum Procedures: none Disposition: stable, discharge to home. Baby Feeding: formula Baby Disposition: home with mom  Rh Immune globulin given: yes (baby O+) Rubella vaccine given: n/a Tdap vaccine given in AP or PP setting: 03/19/16 Flu vaccine given in AP or PP setting: declined 03/26/16  Contraception: Initially desired BTL, but changed mind in house and now desires OCPs.  Prenatal Labs: GBS positive. Rh neg. (MBT O Neg Ab screen Negative Pap Negative HIV Negative Hep B/RPR Negative/Non reactive  Rubella Immune VZV Immune)    Plan:  Danila Eddie was discharged to home in good condition. Follow-up appointment at [facility] 3 weeks for a chlamydia test of cure   Discharge Medications: Allergies as of 05/20/2016   No Known Allergies     Medication List    TAKE these medications   ibuprofen 600 MG tablet Commonly known as:  ADVIL,MOTRIN Take 1 tablet (600 mg total) by mouth every 6 (six) hours.   multivitamin-prenatal 27-0.8 MG Tabs tablet Take 1 tablet by mouth daily at 12 noon.   Norethindrone Acetate-Ethinyl Estrad-FE 1-20 MG-MCG(24) tablet Commonly known as:  JUNEL FE 24 Take 1 tablet by mouth daily.       Follow-up Information    Benjaman Kindler, MD. Schedule an appointment as soon as possible for a visit in 3 week(s).   Specialty:  Obstetrics and Gynecology Why:  for GC/CHL recheck  Contact information: [place]  [phone]           Signed: ----- Larey Days, MD Attending Obstetrician and Gynecologist [facility], Department of [facility]

## 2016-05-19 NOTE — Anesthesia Procedure Notes (Signed)
Epidural Patient location during procedure: OB Start time: 05/19/2016 12:26 AM End time: 05/19/2016 12:42 AM  Staffing Anesthesiologist: Emmie Niemann Performed: anesthesiologist   Preanesthetic Checklist Completed: patient identified, site marked, surgical consent, pre-op evaluation, timeout performed, IV checked, risks and benefits discussed and monitors and equipment checked  Epidural Patient position: sitting Prep: ChloraPrep Patient monitoring: heart rate, continuous pulse ox and blood pressure Approach: midline Location: L3-L4 Injection technique: LOR saline  Needle:  Needle type: Tuohy  Needle gauge: 18 G Needle length: 9 cm and 9 Needle insertion depth: 6.5 cm Catheter type: closed end flexible Catheter size: 20 Guage Catheter at skin depth: 11 cm Test dose: negative (0.125% bupivacaine)  Assessment Events: blood not aspirated, injection not painful, no injection resistance, negative IV test and no paresthesia  Additional Notes   Patient tolerated the insertion well without complications.Reason for block:procedure for pain

## 2016-05-20 LAB — RHOGAM INJECTION: UNIT DIVISION: 0

## 2016-05-20 MED ORDER — NORETHIN ACE-ETH ESTRAD-FE 1-20 MG-MCG(24) PO TABS: 1.0000 | ORAL_TABLET | Freq: Every day | ORAL | 11 refills | Status: DC

## 2016-05-20 MED ORDER — IBUPROFEN 600 MG PO TABS: 600.0000 mg | ORAL_TABLET | Freq: Four times a day (QID) | ORAL | 0 refills | Status: DC

## 2016-05-20 NOTE — Progress Notes (Signed)
Discharge order received from doctor. Reviewed discharge instructions and prescriptions with patient and answered all questions. Follow up appointment instructions given. Patient verbalized understanding. ID bands checked. Patient discharged home with infant via wheelchair by nursing/auxillary.    Sho Salguero Garner, RN  

## 2016-05-20 NOTE — Anesthesia Postprocedure Evaluation (Signed)
Anesthesia Post Note  Patient: Aryanne Nott  Procedure(s) Performed: * No procedures listed *  Patient location during evaluation: Mother Baby Anesthesia Type: Epidural Level of consciousness: awake and alert Pain management: pain level controlled Vital Signs Assessment: post-procedure vital signs reviewed and stable Respiratory status: spontaneous breathing, nonlabored ventilation and respiratory function stable Cardiovascular status: stable Postop Assessment: no headache, no backache and epidural receding Anesthetic complications: no     Last Vitals:  Vitals:   05/19/16 1906 05/20/16 0027  BP: 104/67 115/62  Pulse: 87 75  Resp: 20 20  Temp: 36.7 C 36.4 C    Last Pain:  Vitals:   05/20/16 0027  TempSrc: Oral  PainSc:                  Darlyne Russian

## 2016-05-20 NOTE — Discharge Instructions (Signed)
Discharge instructions:   Call office if you have any of the following: headache, visual changes, fever >101.0 F, chills, breast concerns, excessive vaginal bleeding (saturating more than 1 pad per hour), foul smelling discharge, incision drainage or problems, leg pain or redness, depression or any other concerns.   Activity: Do not lift > 10 lbs for 6 weeks.  No sexual intercourse or tampons for 6 weeks.  No driving for 1-2 weeks.   Call your doctor for increased pain or vaginal bleeding, temperature above 101.0, depression, or concerns.  No strenuous activity or heavy lifting for 6 weeks.  No intercourse, tampons, douching, or enemas for 6 weeks.  No tub baths-showers only.  No driving for 2 weeks or while taking pain medications.  Continue prenatal vitamin and iron.  Increase calories and fluids while breastfeeding.  You may have a slight fever when your milk comes in, but it should go away on its own.  If it does not, and rises above 101.0 please call the doctor.  For concerns about your baby, please call your pediatrician For breastfeeding concerns, the lactation consultant can be reached at [phone]  Please do not start your birth control pills until 1 month after delivery.  Starting them now will increase your risk of blood clot.       Home Care Instructions for Mom  ACTIVITY  Gradually return to your regular activities.  Let yourself rest. Nap while your baby sleeps.  Avoid lifting anything that is heavier than 10 lb (4.5 kg) until your health care provider says it is okay.  Avoid activities that take a lot of effort and energy (are strenuous) until approved by your health care provider. Walking at a slow-to-moderate pace is usually safe.  If you had a cesarean delivery:  Do not vacuum, climb stairs, or drive a car for 4-6 weeks.  Have someone help you at home until you feel like you can do your usual activities yourself.  Do exercises as told by your health care  provider, if this applies. VAGINAL BLEEDING You may continue to bleed for 4-6 weeks after delivery. Over time, the amount of blood usually decreases and the color of the blood usually gets lighter. However, the flow of bright red blood may increase if you have been too active. If you need to use more than one pad in an hour because your pad gets soaked, or if you pass a large clot:  Lie down.  Raise your feet.  Place a cold compress on your lower abdomen.  Rest.  Call your health care provider. If you are breastfeeding, your period should return anytime between 8 weeks after delivery and the time that you stop breastfeeding. If you are not breastfeeding, your period should return 6-8 weeks after delivery. PERINEAL CARE The perineal area, or perineum, is the part of your body between your thighs. After delivery, this area needs special care. Follow these instructions as told by your health care provider.  Take warm tub baths for 15-20 minutes.  Use medicated pads and pain-relieving sprays and creams as told.  Do not use tampons or douches until vaginal bleeding has stopped.  Each time you go to the bathroom:  Use a peri bottle.  Change your pad.  Use towelettes in place of toilet paper until your stitches have healed.  Do Kegel exercises every day. Kegel exercises help to maintain the muscles that support the vagina, bladder, and bowels. You can do these exercises while you are standing,  sitting, or lying down. To do Kegel exercises:  Tighten the muscles of your abdomen and the muscles that surround your birth canal.  Hold for a few seconds.  Relax.  Repeat until you have done this 5 times in a row.  To prevent hemorrhoids from developing or getting worse:  Drink enough fluid to keep your urine clear or pale yellow.  Avoid straining when having a bowel movement.  Take over-the-counter medicines and stool softeners as told by your health care provider. BREAST  CARE  Wear a tight-fitting bra.  Avoid taking over-the-counter pain medicine for breast discomfort.  Apply ice to the breasts to help with discomfort as needed:  Put ice in a plastic bag.  Place a towel between your skin and the bag.  Leave the ice on for 20 minutes or as told by your health care provider. NUTRITION  Eat a well-balanced diet.  Do not try to lose weight quickly by cutting back on calories.  Take your prenatal vitamins until your postpartum checkup or until your health care provider tells you to stop. POSTPARTUM DEPRESSION You may find yourself crying for no apparent reason and unable to cope with all of the changes that come with having a newborn. This mood is called postpartum depression. Postpartum depression happens because your hormone levels change after delivery. If you have postpartum depression, get support from your partner, friends, and family. If the depression does not go away on its own after several weeks, contact your health care provider. BREAST SELF-EXAM Do a breast self-exam each month, at the same time of the month. If you are breastfeeding, check your breasts just after a feeding, when your breasts are less full. If you are breastfeeding and your period has started, check your breasts on day 5, 6, or 7 of your period. Report any lumps, bumps, or discharge to your health care provider. Know that breasts are normally lumpy if you are breastfeeding. This is temporary, and it is not a health risk. INTIMACY AND SEXUALITY Avoid sexual activity for at least 3-4 weeks after delivery or until the brownish-red vaginal flow is completely gone. If you want to avoid pregnancy, use some form of birth control. You can get pregnant after delivery, even if you have not had your period. SEEK MEDICAL CARE IF:  You feel unable to cope with the changes that a child brings to your life, and these feelings do not go away after several weeks.  You notice a lump, a bump, or  discharge on your breast. SEEK IMMEDIATE MEDICAL CARE IF:  Blood soaks your pad in 1 hour or less.  You have:  Severe pain or cramping in your lower abdomen.  A bad-smelling vaginal discharge.  A fever that is not controlled by medicine.  A fever, and an area of your breast is red and sore.  Pain or redness in your calf.  Sudden, severe chest pain.  Shortness of breath.  Painful or bloody urination.  Problems with your vision.  You vomit for 12 hours or longer.  You develop a severe headache.  You have serious thoughts about hurting yourself, your child, or anyone else. This information is not intended to replace advice given to you by your health care provider. Make sure you discuss any questions you have with your health care provider. Document Released: 01/30/2000 Document Revised: 07/10/2015 Document Reviewed: 08/05/2014 Elsevier Interactive Patient Education  2017 [employer].

## 2017-09-15 ENCOUNTER — Other Ambulatory Visit: Payer: Self-pay

## 2017-09-15 ENCOUNTER — Emergency Department
Admission: EM | Admit: 2017-09-15 | Discharge: 2017-09-15 | Disposition: A | Discharge status: Home / Self Care | Payer: Commercial Managed Care - PPO | Attending: Emergency Medicine | Admitting: Emergency Medicine

## 2017-09-15 DIAGNOSIS — M62838 Other muscle spasm: Secondary | ICD-10-CM | POA: Diagnosis not present

## 2017-09-15 DIAGNOSIS — G44209 Tension-type headache, unspecified, not intractable: Secondary | ICD-10-CM

## 2017-09-15 DIAGNOSIS — M542 Cervicalgia: Secondary | ICD-10-CM | POA: Diagnosis present

## 2017-09-15 MED ORDER — CYCLOBENZAPRINE HCL 5 MG PO TABS: 5.0000 mg | ORAL_TABLET | Freq: Three times a day (TID) | ORAL | 0 refills | Status: DC | PRN

## 2017-09-15 MED ORDER — NAPROXEN 500 MG PO TABS: 500.0000 mg | ORAL_TABLET | Freq: Two times a day (BID) | ORAL | 0 refills | Status: AC

## 2017-09-15 NOTE — ED Triage Notes (Signed)
Pt arrives to ED c/o of neck pain since Monday. States causing a HA. States feels tight. Alert, oriented, ambulatory. No distress noted.

## 2017-09-15 NOTE — Discharge Instructions (Addendum)
Your exam is consistent with trapezius muscle spasms and muscle tension headache. Take the prescription meds as directed. Continue to use your daily ice, moist heat, Biofreeze and massage. Do NOT take the muscle relaxant and drive to work. Follow-up with your provider for ongoing symptoms.

## 2017-09-15 NOTE — ED Notes (Signed)
See triage note.  States she woke up with neck pain on Monday .  Denies any injury   States she has been getting some relief with topical meds and aleve  States now the pain is moving into right shoulder

## 2017-09-15 NOTE — ED Provider Notes (Signed)
[facility] Emergency Department Provider Note ____________________________________________  Time seen: 1610  I have reviewed the triage vital signs and the nursing notes.  HISTORY  Chief Complaint  Neck Pain  HPI Rebecca Francis is a 27 y.o. female presents herself to the ED for evaluation of 3 to 4-day complaint of right-sided neck and upper back muscle pain.  Patient describes tenderness and spasm to the right upper neck and shoulder, after she awoke Monday morning after third shift at the [employer].  She does admit to some increased work load on that Sunday night shift.  She denies any preceding injury, accident, or trauma.  Also denies any distal paresthesias, grip strength changes, or weakness.  She has been taking naproxen since yesterday, and has been using ice, heat, and Biofreeze topically.  She denies any history of previous ongoing neck pain.  She also denies any chest pain or shortness of breath.  She notes that range to the right is limited secondary to pain and that the muscle tension often causes a headache across the back of the head.  She denies any visual disturbance or syncope.  History reviewed. No pertinent past medical history.  Patient Active Problem List   Diagnosis Date Noted  . Premature rupture of membranes (PROM) affecting third pregnancy 05/18/2016  . Pregnancy 05/17/2016  . First trimester screening 12/04/2015    History reviewed. No pertinent surgical history.  Prior to Admission medications   Medication Sig Start Date End Date Taking? Authorizing Provider  cyclobenzaprine (FLEXERIL) 5 MG tablet Take 1 tablet (5 mg total) by mouth 3 (three) times daily as needed for muscle spasms. 09/15/17   Marlen Mollica, Dannielle Karvonen, PA-C  naproxen (NAPROSYN) 500 MG tablet Take 1 tablet (500 mg total) by mouth 2 (two) times daily with a meal for 15 days. 09/15/17 09/30/17  Katelinn Justice, Dannielle Karvonen, PA-C  Norethindrone Acetate-Ethinyl Estrad-FE (JUNEL FE 24)  1-20 MG-MCG(24) tablet Take 1 tablet by mouth daily. 05/20/16   Ward, Honor Loh, MD    Allergies Patient has no known allergies.  History reviewed. No pertinent family history.  Social History Social History   Tobacco Use  . Smoking status: Never Smoker  . Smokeless tobacco: Never Used  Substance Use Topics  . Alcohol use: No  . Drug use: No    Review of Systems  Constitutional: Negative for fever. Eyes: Negative for visual changes. ENT: Negative for sore throat. Cardiovascular: Negative for chest pain. Respiratory: Negative for shortness of breath. Gastrointestinal: Negative for abdominal pain, vomiting and diarrhea. Genitourinary: Negative for dysuria. Musculoskeletal: Positive for right upper back and neck pain. Skin: Negative for rash. Neurological: Negative for headaches, focal weakness or numbness. ____________________________________________  PHYSICAL EXAM:  VITAL SIGNS: ED Triage Vitals [09/15/17 1529]  Enc Vitals Group     BP 118/70     Pulse Rate 86     Resp 16     Temp 98 F (36.7 C)     Temp Source Oral     SpO2 100 %     Weight 180 lb (81.6 kg)     Height 5\' 2"  (1.575 m)     Head Circumference      Peak Flow      Pain Score 7     Pain Loc      Pain Edu?      Excl. in [place]?     Constitutional: Alert and oriented. Well appearing and in no distress. Head: Normocephalic and atraumatic. Eyes: Conjunctivae  are normal. Normal extraocular movements Neck: Supple.  Normal range of motion without crepitus.  No palpable muscle spasm appreciated.  Patient is tender to palpation to the right upper trapezius musculature.  No distracting midline tenderness is noted. Cardiovascular: Normal rate, regular rhythm. Normal distal pulses. Respiratory: Normal respiratory effort. No wheezes/rales/rhonchi. Musculoskeletal: Normal composite fist bilaterally.  Normal grip strength noted bilaterally.  Nontender with normal range of motion in all extremities.  Neurologic:  Cranial nerves II through XII grossly intact.  Normal UE DTRs bilaterally.  Normal gait without ataxia. Normal speech and language. No gross focal neurologic deficits are appreciated. Skin:  Skin is warm, dry and intact. No rash noted. Psychiatric: Mood and affect are normal. Patient exhibits appropriate insight and judgment. ____________________________________________  INITIAL IMPRESSION / ASSESSMENT AND PLAN / ED COURSE  Patient with ED evaluation of right-sided neck and upper back muscle strain.  Patient symptoms are consistent with trapezius muscle strain and muscle spasms.  She is also had some associated muscle tension headache.  Patient will be discharged with prescriptions for naproxen and Flexeril dose as directed.  She is cautioned against taking the muscle relaxant prior to her third shift.  She will continue with topical analgesics and follow-up with her primary provider for ongoing symptoms. ____________________________________________  FINAL CLINICAL IMPRESSION(S) / ED DIAGNOSES  Final diagnoses:  Trapezius muscle spasm  Acute non intractable tension-type headache      Sharief Wainwright, Dannielle Karvonen, PA-C 09/15/17 1630    Earleen Newport, MD 09/15/17 2136

## 2017-09-29 IMAGING — US US MFM OB COMPLETE +14 WKS
1 series · 14 of 28 positions shown · non-contrast
Comparison: none

PATIENT INFO:

PERFORMED BY:
SERVICE(S) PROVIDED:
US MFM OB COMP LESS THAN 14 WEEKS                     76801.4
INDICATIONS:
Weeks of gestation of pregnancy not
specified
FETAL EVALUATION:
Num Of Fetuses:     1
COMMENTS:
INDICATION: aneuploidy screening/first trimester screening
Thank you for referring your patient, Busie Volker for first
trimester screening.  Dating is by LMP.  Today's ultrasound is
consistent with LMP.   She had the opportunity to meet with
our genetic counselor today.  Please see that note for details.

[Series 1: us mfm ob complete +14 wks · 0.22mm/px · 43 acquisitions, 14 frames shown]
[im 2/43]
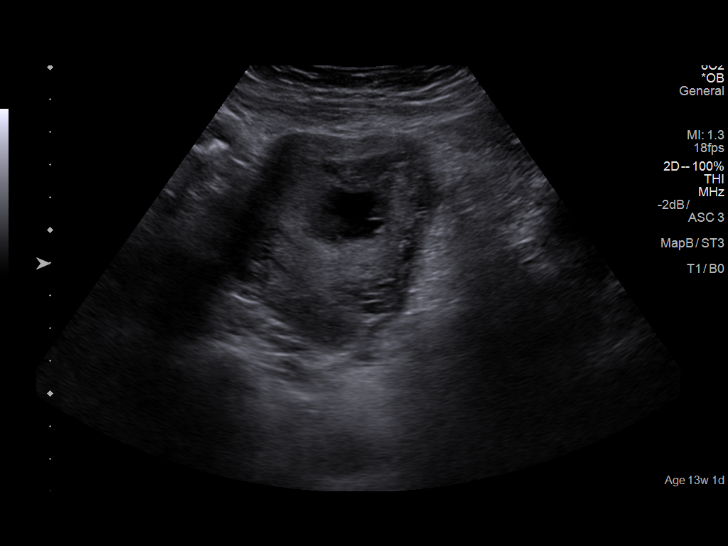
[im 5/43]
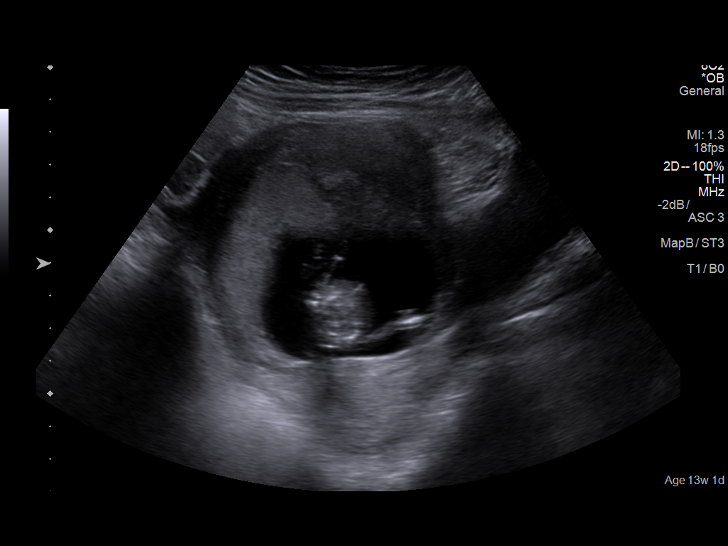
[im 8/43]
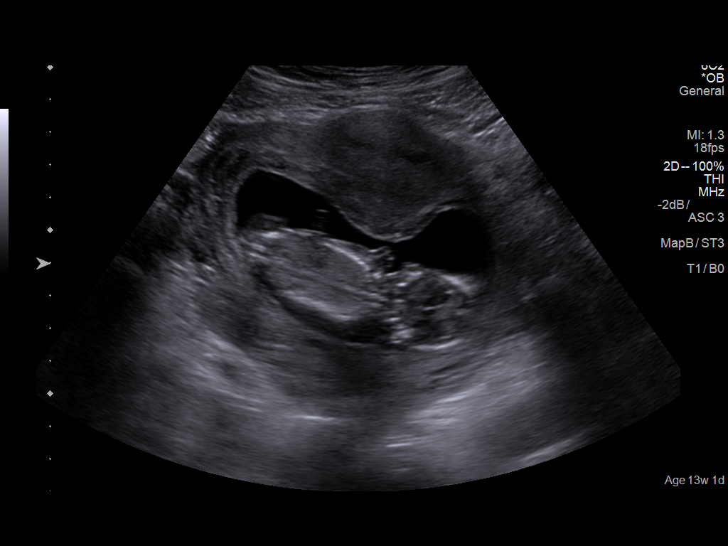
[im 11/43]
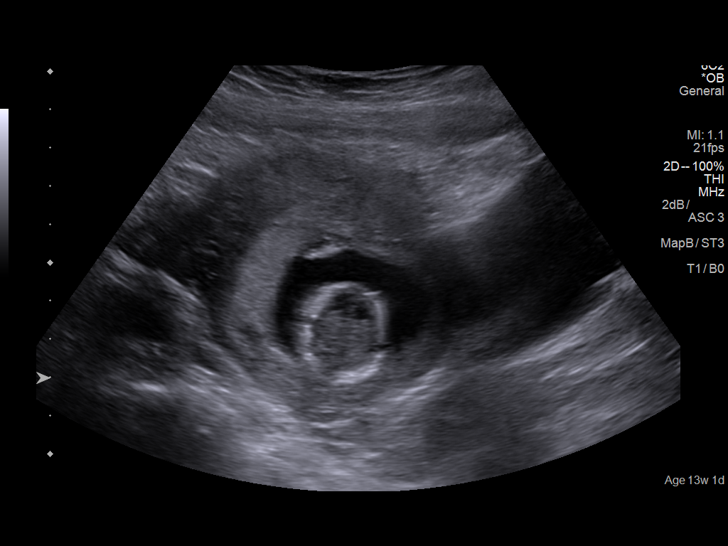
[im 15/43]
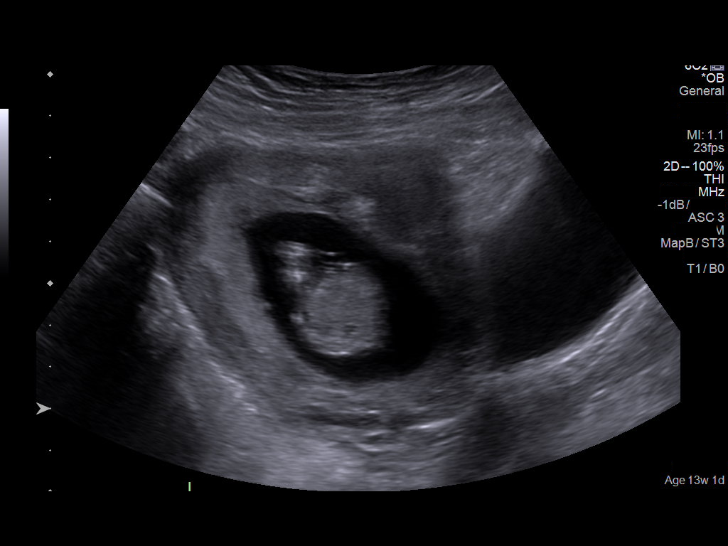
[im 18/43]
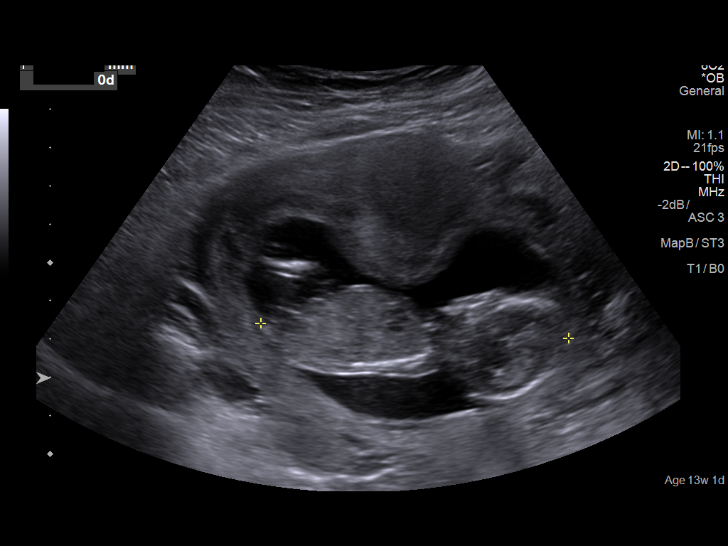
[im 21/43]
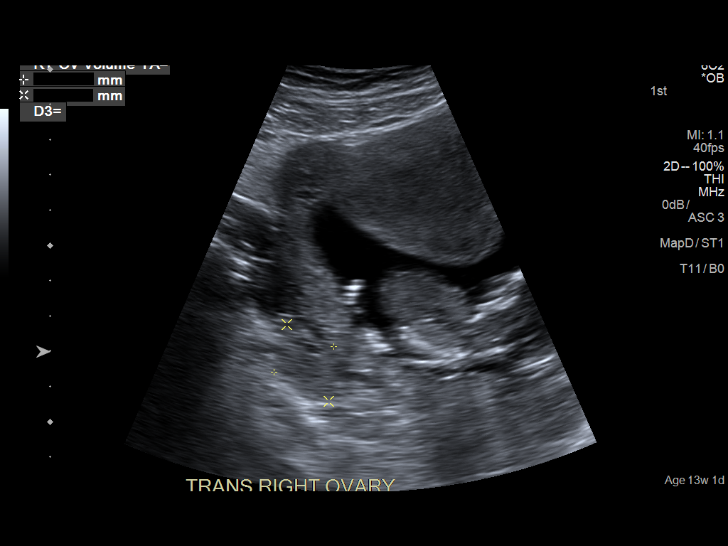
[im 24/43]
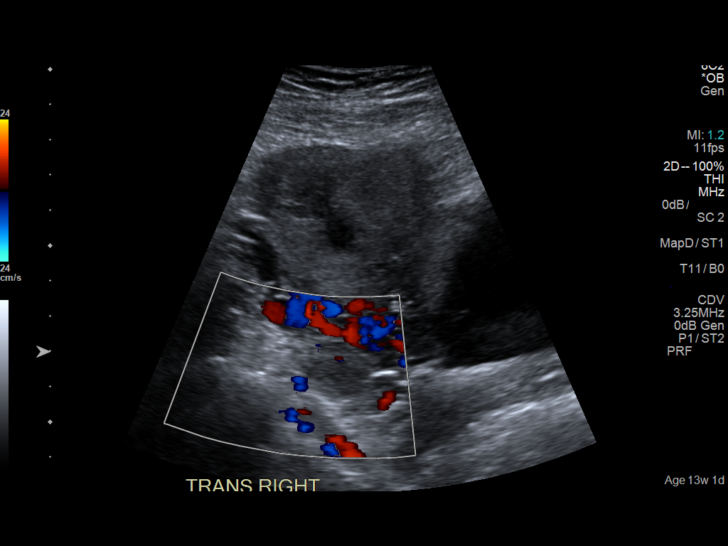
[im 27/43]
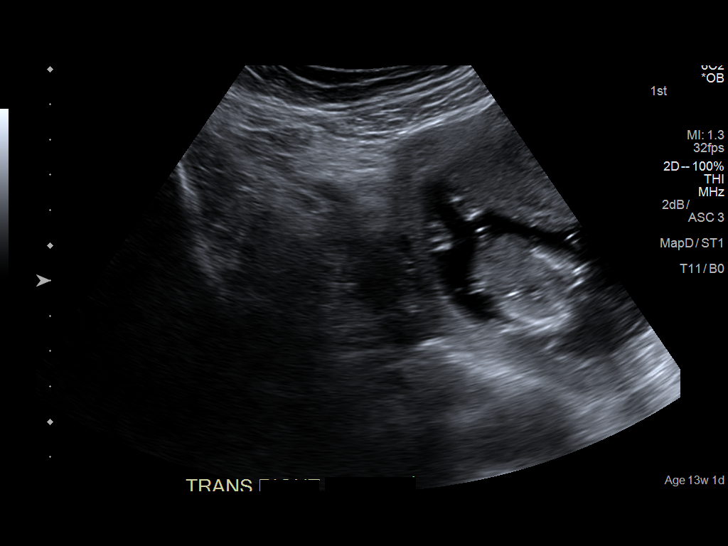
[im 30/43]
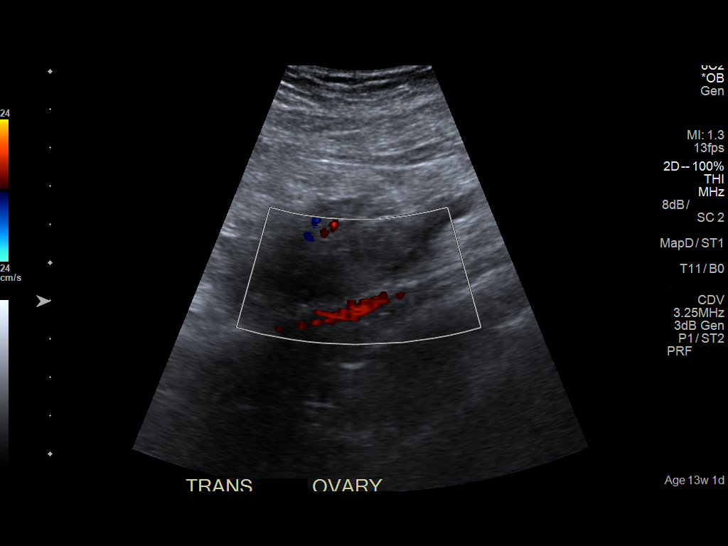
[im 33/43]
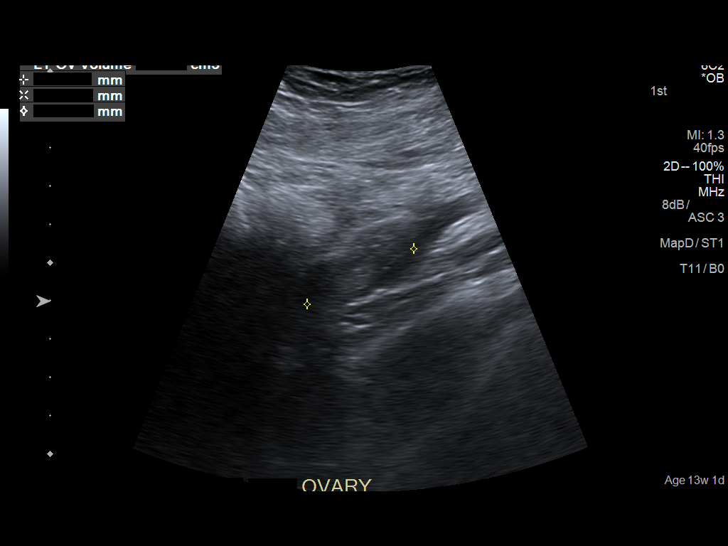
[im 36/43]
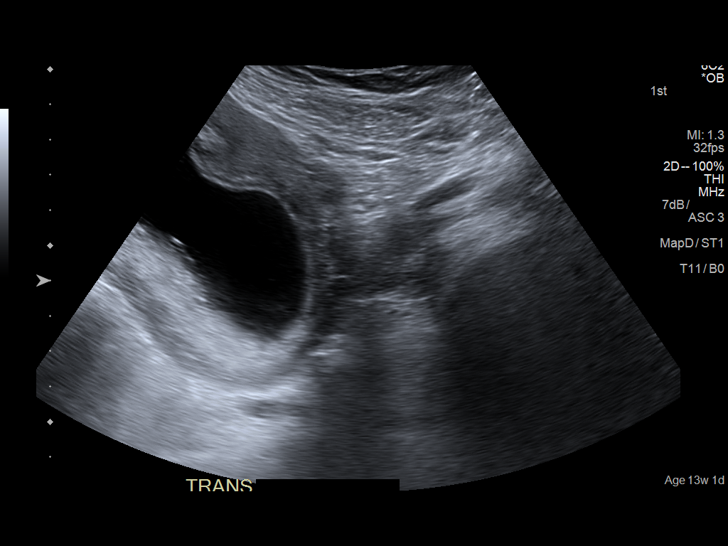
[im 39/43]
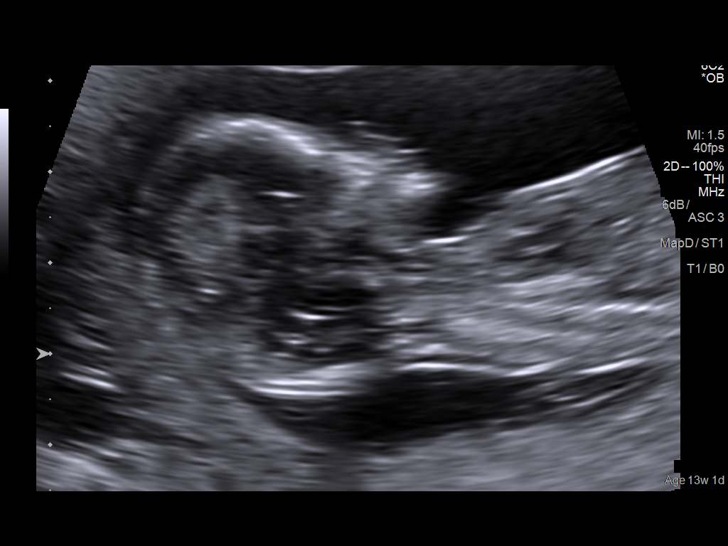
[im 43/43]
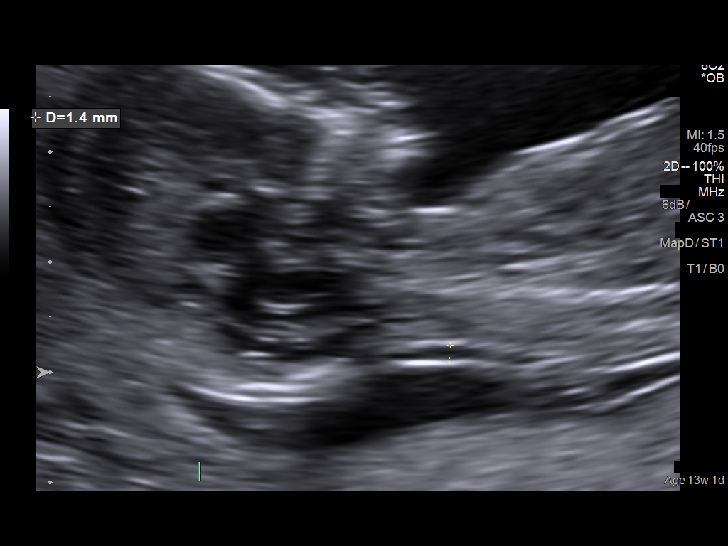

[14 of 28 positions shown; findings below may reference images not displayed]

Ultrasound demonstrates a single, live, intrauterine
pregnancy at 12 weeks 5 days. The fetal biometry is
consistent with the gestational age.  Symmetric choroid
plexus, bladder, and limbs are seen.  The fetal gestational
age is too early for a detailed anatomic survey.
The best nuchal translucency measures 1.4mm.   Serum
analytes will be obtained today and will contact her with
results.
Maternal ovaries appear normal bilaterally.

## 2017-10-31 IMAGING — US US MFM OB DETAIL+14 WK
1 series · 13 of 28 positions shown · non-contrast
Comparison: none

PATIENT INFO:

PERFORMED BY:
SERVICE(S) PROVIDED:
INDICATIONS:
17 weeks gestation of pregnancy
Anatomy
BMI over 30
FETAL EVALUATION:
Num Of Fetuses:     1
Fetal Heart         149
Rate(bpm):
Cardiac Activity:   Present
Presentation:       Breech
Placenta:           Posterior
Largest Pocket(cm)
3.4
BIOMETRY:
BPD:        39  mm     G. Age:  17w 6d         57  %    CI:        74.79   %    70 - 86
FL/HC:       17.9  %    15.8 - 18
HC:      143.1  mm     G. Age:  17w 4d         34  %
FL/BPD:      65.6  %
FL:       25.6  mm     G. Age:  17w 5d         47  %
HUM:      25.6  mm     G. Age:  18w 0d         65  %
CER:      17.7  mm     G. Age:  17w 5d         50  %
NFT:       3.3  mm
CM:          4  mm
GESTATIONAL AGE:
LMP:           17w 5d        Date:  09/03/15                 EDD:   06/09/16
U/S Today:     17w 5d                                        EDD:   06/09/16
Best:          17w 5d     Det. By:  LMP  (09/03/15)          EDD:   06/09/16
ANATOMY:
Cranium:               Within Normal Limits   Aortic Arch:            Normal appearance
Cavum:                 CSP visualized         Ductal Arch:            Normal appearance
Ventricles:            Normal appearance      Diaphragm:              Within Normal Limits
Choroid Plexus:        Within Normal Limits   Stomach:                Seen
Cerebellum:            Within Normal Limits   Abdomen:                Within Normal
Limits
Posterior Fossa:       Within Normal Limits   Abdominal Wall:         Normal appearance
Nuchal Fold:           Within Normal Limits   Cord Vessels:           3 vessels
Face:                  Orbits visualized      Kidneys:                Normal appearance
Lips:                  Normal appearance      Bladder:                Seen
Thoracic:              Within Normal Limits   Spine:                  Normal appearance
Heart:                 4-Chamber view         Upper Extremities:      Visualized
appears normal
RVOT:                  Normal appearance      Lower Extremities:      Visualized
LVOT:                  Normal appearance
Other:  I

[Series 1: us mfm ob detail+14 wk · 0.22mm/px · 13 of 89 slices shown]
[im 4/89]
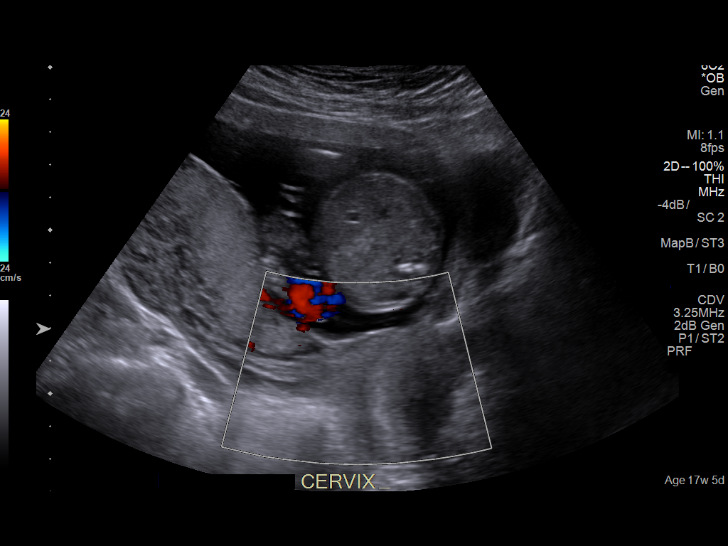
[im 10/89]
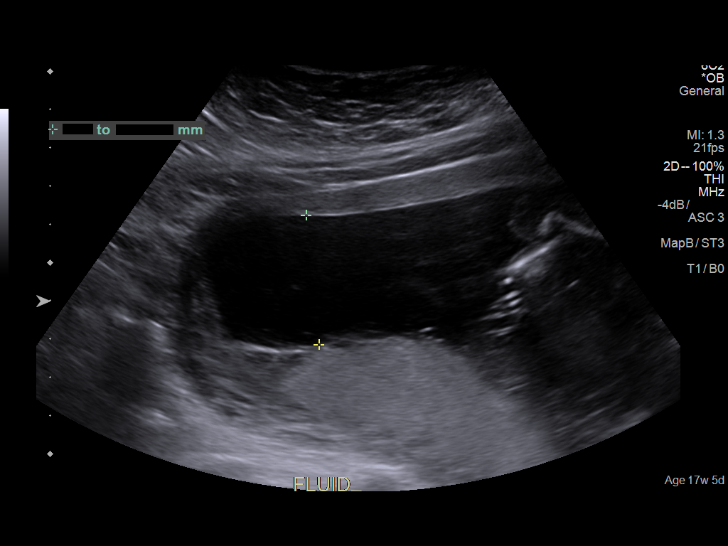
[im 17/89]
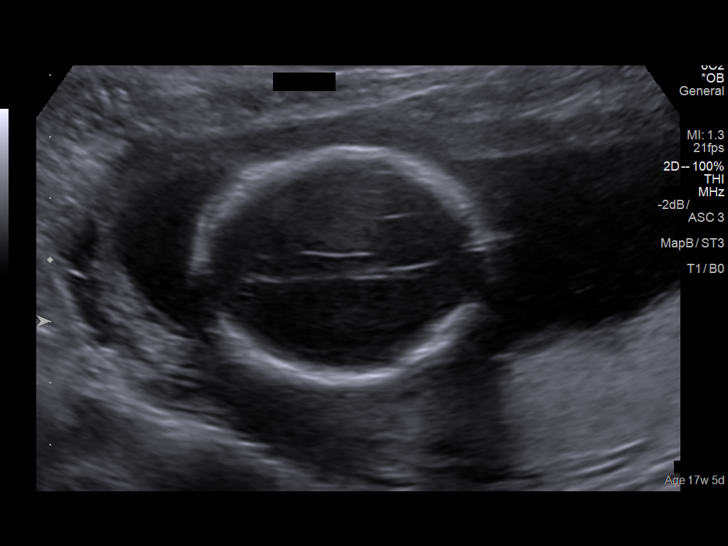
[im 23/89]
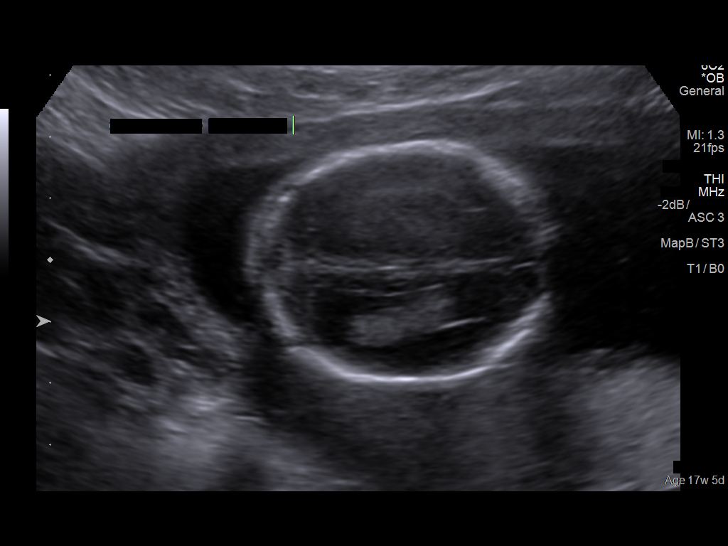
[im 30/89]
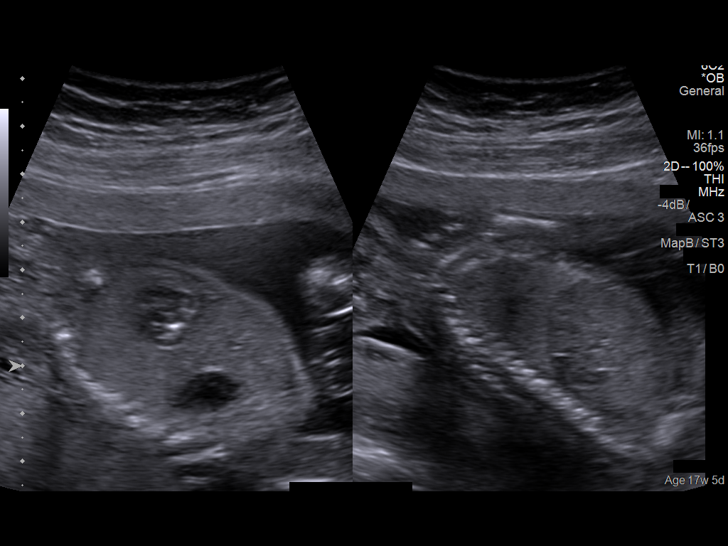
[im 36/89]
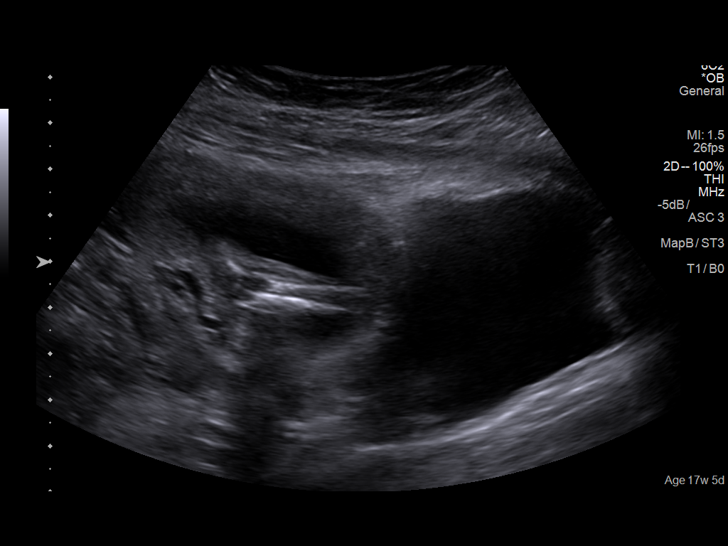
[im 46/89]
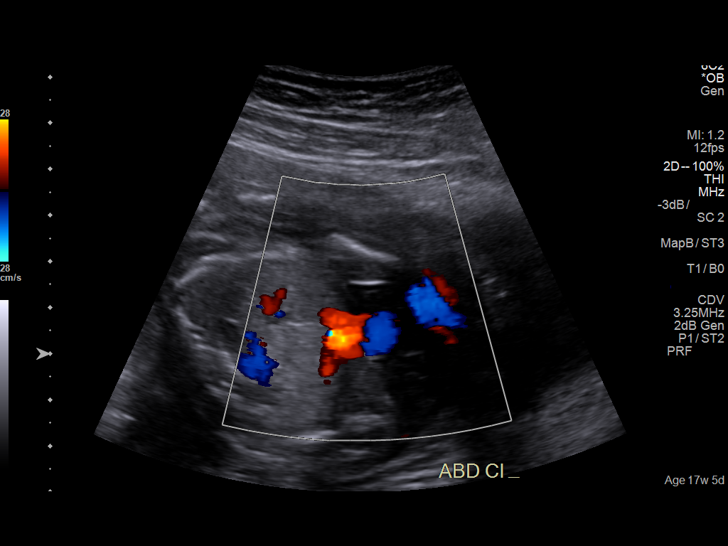
[im 53/89]
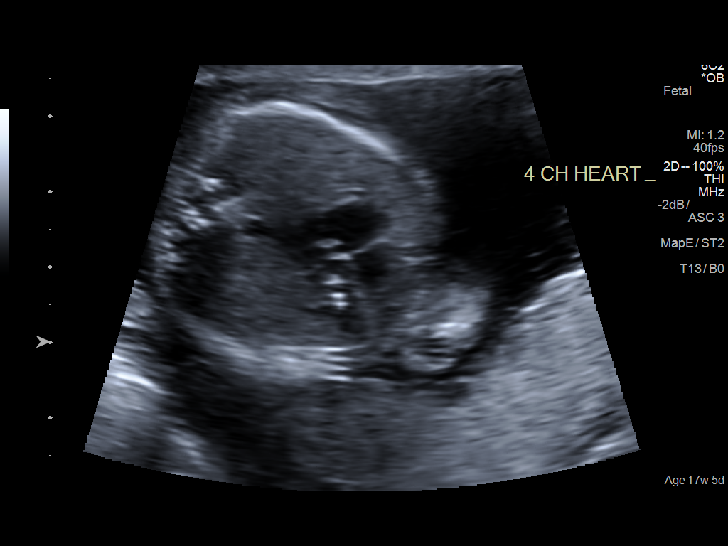
[im 59/89]
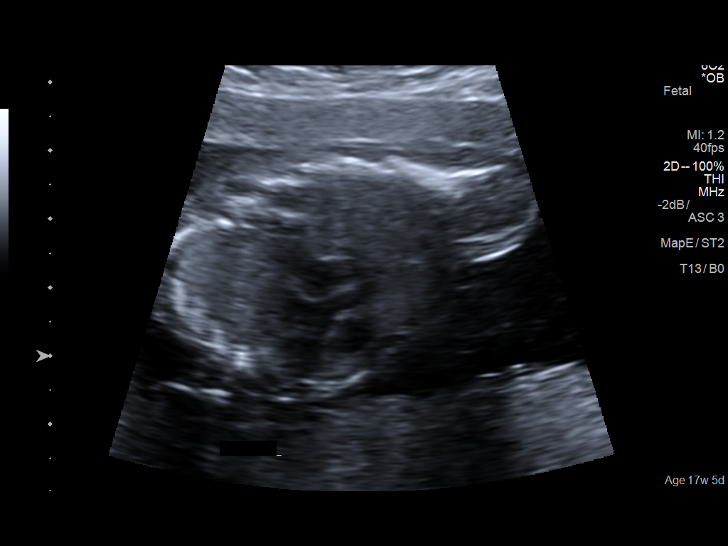
[im 66/89]
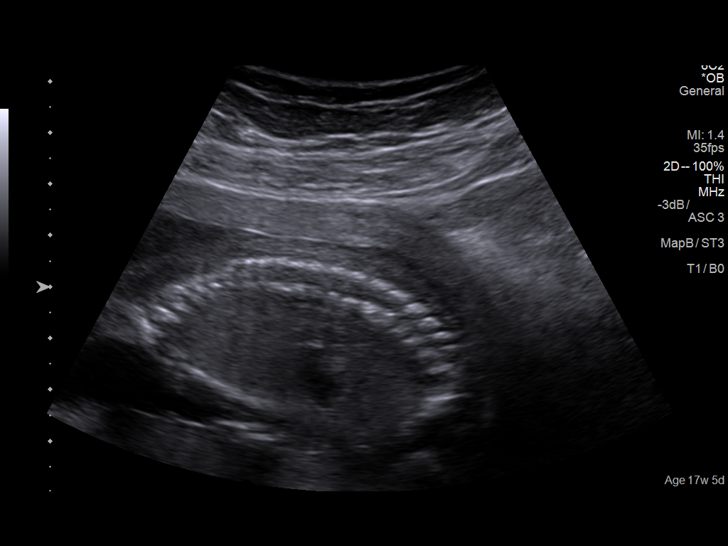
[im 72/89]
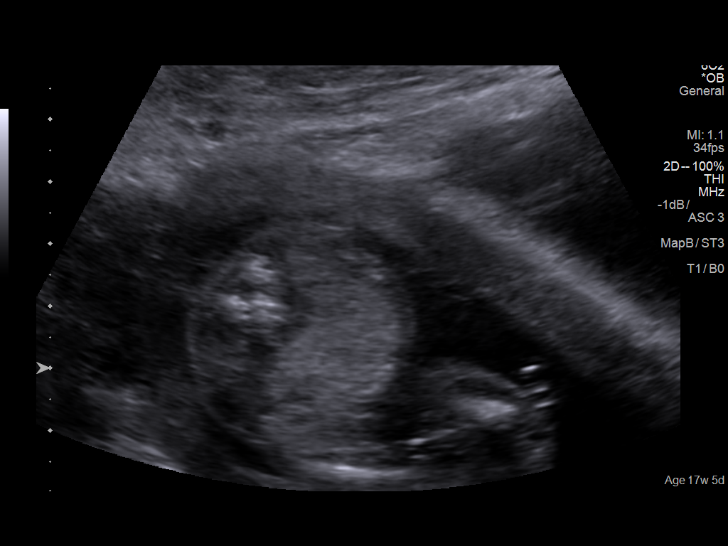
[im 79/89]
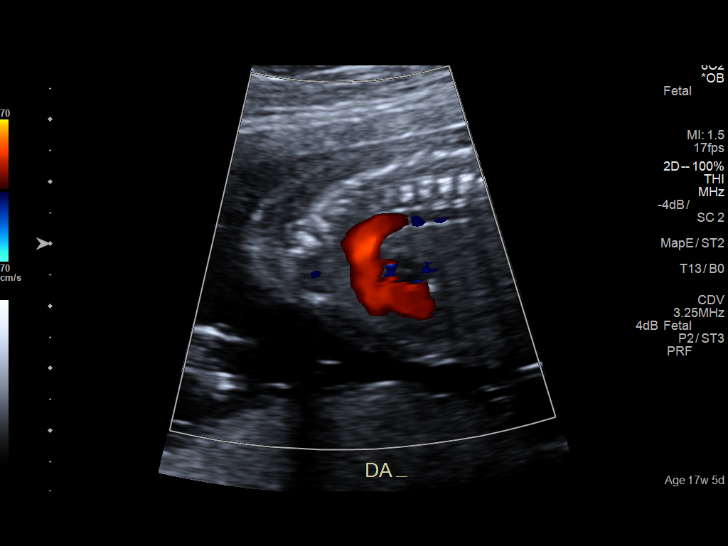
[im 85/89]
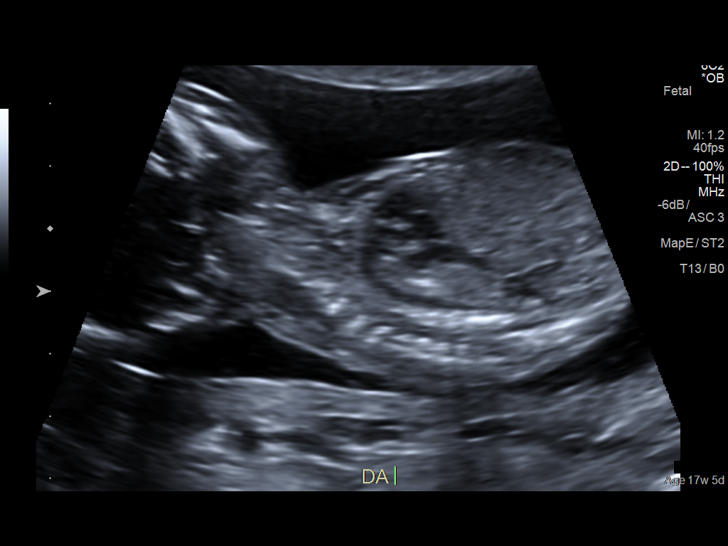

[13 of 28 positions shown; findings below may reference images not displayed]

IMPRESSION: Thank you for referring your patient for detailed anatomic
survey due to elevated maternal BMI.  She had normal first
trimester screening.

Ultrasound demonstrates a single, live intrauterine pregnancy
at 17 weeks 3 days.  Dating is by LMP consistent with
ultrasound performed at [HOSPITAL] on 12/04/15; measurements
were consistent with 12 weeks 5 days.

The detailed anatomic survey is unremarkable.  The amniotic
fluid volume is normal and active fetal movements are seen.

## 2018-03-13 IMAGING — CT CT RENAL STONE PROTOCOL
1 of 2 series · 5 of 32 positions shown, 10 images · non-contrast
Comparison: None.

CLINICAL DATA: Left-sided flank abdominal pain with nausea.

EXAM:
CT ABDOMEN AND PELVIS WITHOUT CONTRAST
TECHNIQUE: Multidetector CT imaging of the abdomen and pelvis was performed
following the standard protocol without IV contrast.

[Series 4: lung windows · axial · 0.72mm/px · z∈[-862,-782]mm · 5 of 25 slices shown, 10 images]
[im 5/25  soft-tissue]
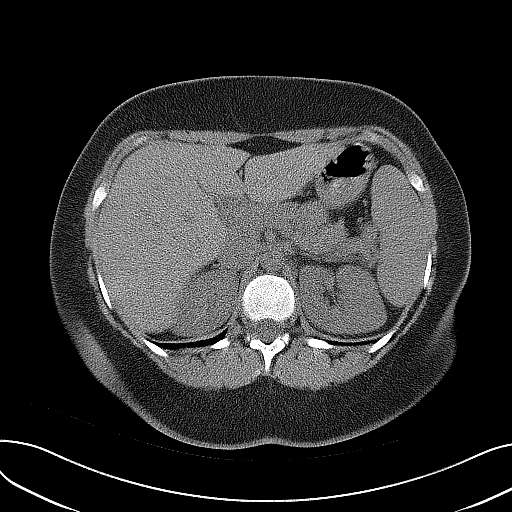
[im 5/25  bone]
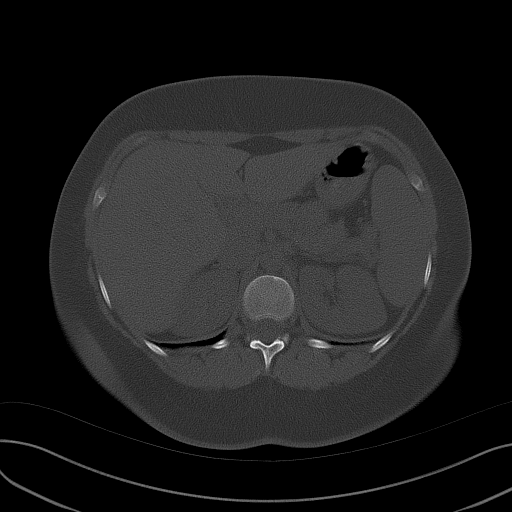
[im 9/25  soft-tissue]
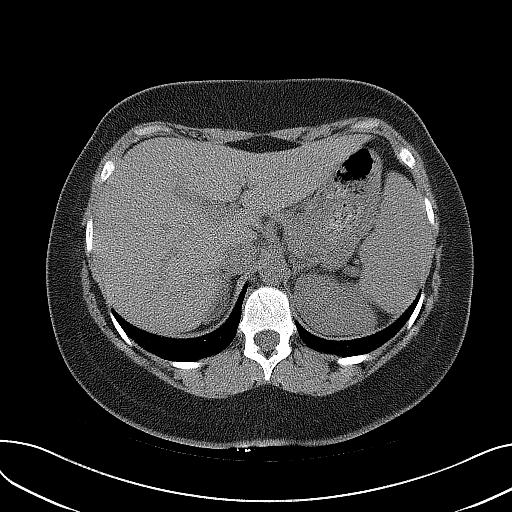
[im 9/25  lung]
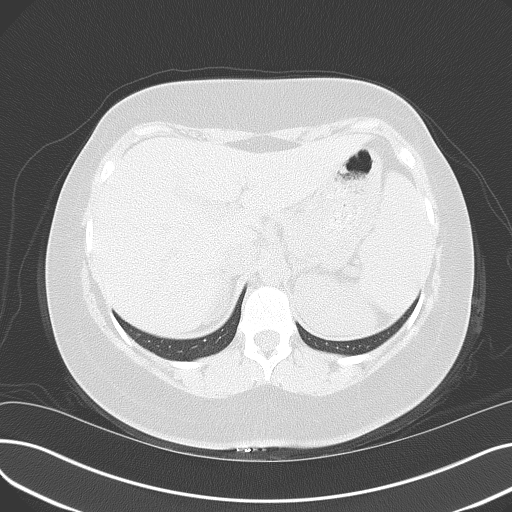
[im 13/25  soft-tissue]
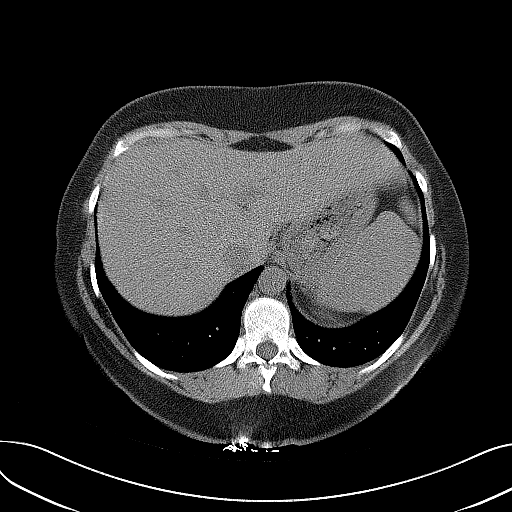
[im 13/25  lung]
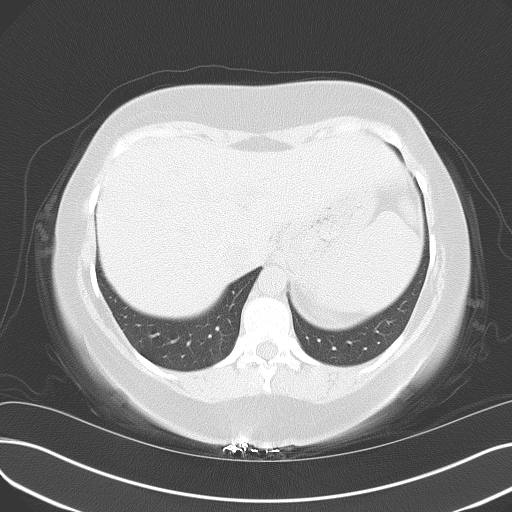
[im 17/25  soft-tissue]
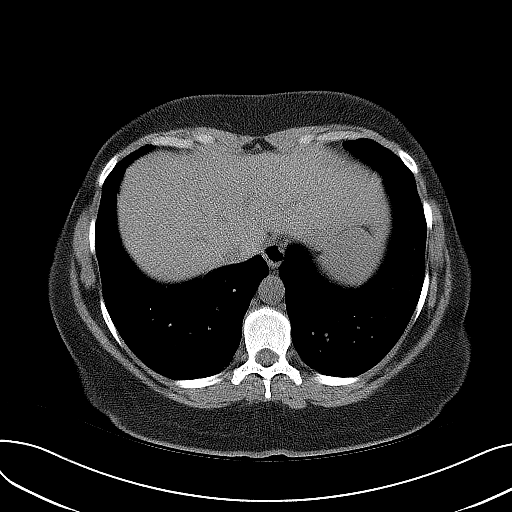
[im 17/25  lung]
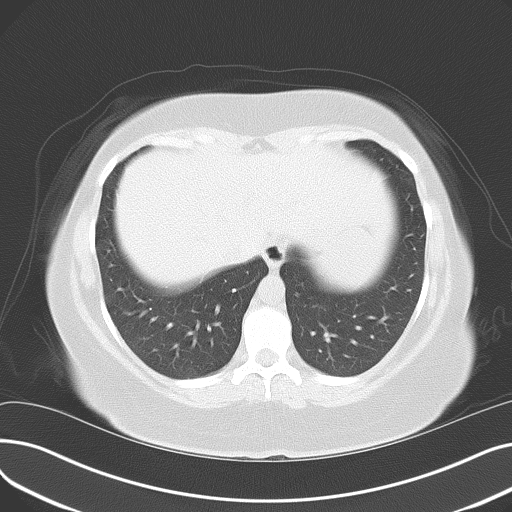
[im 21/25  soft-tissue]
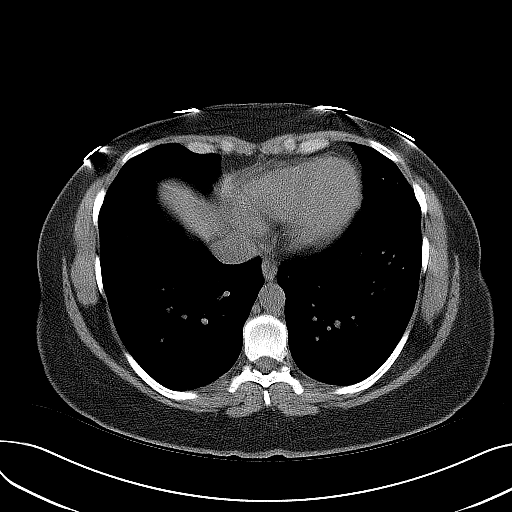
[im 21/25  lung]
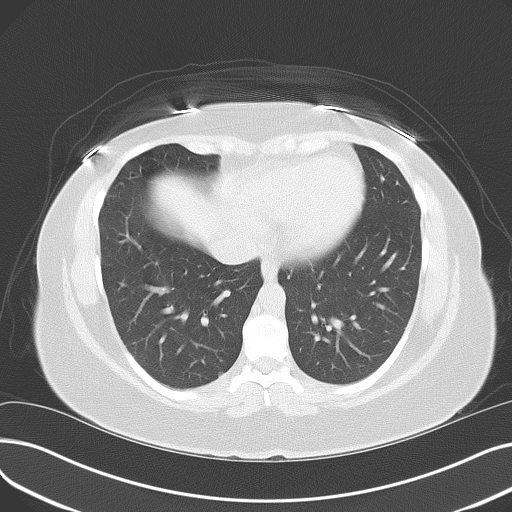

[5 of 32 positions shown; findings below may reference images not displayed]

FINDINGS: Lower chest:  Normal visualized lung bases.

Hepatobiliary: Unenhanced appearance of the liver and gallbladder
are within normal limits.

Pancreas: Unenhanced appearance of the pancreas is unremarkable.

Spleen: Enhance appearance of the spleen is unremarkable.

Adrenals/Urinary Tract: The kidneys demonstrate no evidence of
hydronephrosis or lesion. No urinary tract calculi identified. The
bladder appears unremarkable.

Stomach/Bowel: Bowel shows no evidence of obstruction or
inflammation. No free air or abscess identified.

Vascular/Lymphatic: No enlarged lymph nodes are seen.

Reproductive: The uterus and adnexal regions appear unremarkable by
CT. No pelvic fluid.

Other: No hernias are identified.

Musculoskeletal: Bony structures are within normal limits.
IMPRESSION: No acute findings by CT of the abdomen and pelvis without contrast.

## 2018-03-19 ENCOUNTER — Emergency Department
Admission: EM | Admit: 2018-03-19 | Discharge: 2018-03-19 | Disposition: A | Discharge status: Home / Self Care | Payer: Commercial Managed Care - PPO | Attending: Emergency Medicine | Admitting: Emergency Medicine

## 2018-03-19 ENCOUNTER — Other Ambulatory Visit: Payer: Self-pay

## 2018-03-19 DIAGNOSIS — Z79899 Other long term (current) drug therapy: Secondary | ICD-10-CM | POA: Insufficient documentation

## 2018-03-19 DIAGNOSIS — T161XXA Foreign body in right ear, initial encounter: Secondary | ICD-10-CM | POA: Diagnosis not present

## 2018-03-19 DIAGNOSIS — Y998 Other external cause status: Secondary | ICD-10-CM | POA: Insufficient documentation

## 2018-03-19 DIAGNOSIS — Y929 Unspecified place or not applicable: Secondary | ICD-10-CM | POA: Insufficient documentation

## 2018-03-19 DIAGNOSIS — Y9389 Activity, other specified: Secondary | ICD-10-CM | POA: Diagnosis not present

## 2018-03-19 DIAGNOSIS — X58XXXA Exposure to other specified factors, initial encounter: Secondary | ICD-10-CM | POA: Diagnosis not present

## 2018-03-19 MED ORDER — NEOMYCIN-POLYMYXIN-HC 3.5-10000-1 OT SOLN: 3.0000 [drp] | Freq: Three times a day (TID) | OTIC | 0 refills | Status: AC

## 2018-03-19 NOTE — ED Triage Notes (Signed)
Patient to ED for possible bug in right ear. Happened earlier today while she was out of town. Has poured vegetable oil, peroxide, ear wax removal medicine, and water in the ear. Can feel something still moving a little bit when she moves her head. Patient is calm at this time.

## 2018-03-19 NOTE — ED Notes (Signed)
PA Roderic Palau out of room; says he successfully removed a bug from pt's right ear; pt said she could feel it still moving after multiple attempts to remove it at home;

## 2018-03-19 NOTE — ED Provider Notes (Signed)
[facility] Emergency Department Provider Note  ____________________________________________  Time seen: Approximately 8:33 PM  I have reviewed the triage vital signs and the nursing notes.   HISTORY  Chief Complaint Foreign Body in Ear    HPI Rebecca Francis is a 28 y.o. female who presents the emergency department complaining of possible foreign body in the right ear.  Patient reports that she believes there is an insect in her right ear.  She is tried multiple attempts including flushes with water, over-the-counter medications to remove insect.  Patient reports that she can still feel movement in her ear and has muffled hearing.  No other complaints at this time.    History reviewed. No pertinent past medical history.  Patient Active Problem List   Diagnosis Date Noted  . Premature rupture of membranes (PROM) affecting third pregnancy 05/18/2016  . Pregnancy 05/17/2016  . First trimester screening 12/04/2015    History reviewed. No pertinent surgical history.  Prior to Admission medications   Medication Sig Start Date End Date Taking? Authorizing Provider  cyclobenzaprine (FLEXERIL) 5 MG tablet Take 1 tablet (5 mg total) by mouth 3 (three) times daily as needed for muscle spasms. 09/15/17   Menshew, Dannielle Karvonen, PA-C  neomycin-polymyxin-hydrocortisone (CORTISPORIN) OTIC solution Place 3 drops into the right ear 3 (three) times daily for 7 days. 03/19/18 03/26/18  Johnchristopher Sarvis, Charline Bills, PA-C  Norethindrone Acetate-Ethinyl Estrad-FE (JUNEL FE 24) 1-20 MG-MCG(24) tablet Take 1 tablet by mouth daily. 05/20/16   Ward, Honor Loh, MD    Allergies Patient has no known allergies.  No family history on file.  Social History Social History   Tobacco Use  . Smoking status: Never Smoker  . Smokeless tobacco: Never Used  Substance Use Topics  . Alcohol use: No  . Drug use: No     Review of Systems  Constitutional: No fever/chills Eyes: No visual changes.  No discharge ENT: Possible foreign body in the right ear Cardiovascular: no chest pain. Respiratory: no cough. No SOB. Gastrointestinal: No abdominal pain.  No nausea, no vomiting.  musculoskeletal: Negative for musculoskeletal pain. Skin: Negative for rash, abrasions, lacerations, ecchymosis. Neurological: Negative for headaches, focal weakness or numbness. 10-point ROS otherwise negative.  ____________________________________________   PHYSICAL EXAM:  VITAL SIGNS: ED Triage Vitals  Enc Vitals Group     BP 03/19/18 1926 107/72     Pulse Rate 03/19/18 1926 87     Resp 03/19/18 1926 18     Temp 03/19/18 1926 98.2 F (36.8 C)     Temp Source 03/19/18 1926 Oral     SpO2 --      Weight 03/19/18 1928 190 lb (86.2 kg)     Height 03/19/18 1928 5\' 2"  (1.575 m)     Head Circumference --      Peak Flow --      Pain Score 03/19/18 1927 0     Pain Loc --      Pain Edu? --      Excl. in [place]? --      Constitutional: Alert and oriented. Well appearing and in no acute distress. Eyes: Conjunctivae are normal. PERRL. EOMI. Head: Atraumatic. ENT:      Ears: EAC and TM on left is unremarkable.  EAC on right with obvious winged insect in the EAC.  Patient does have some minimal bleeding around insect.  TM is visualized with no perforation.  After removal, patient does have irritation of the EAC with minimal bleeding.  TM is  visualized with no perforations.      Nose: No congestion/rhinnorhea.      Mouth/Throat: Mucous membranes are moist.  Neck: No stridor.    Cardiovascular: Normal rate, regular rhythm. Normal S1 and S2.  Good peripheral circulation. Respiratory: Normal respiratory effort without tachypnea or retractions. Lungs CTAB. Good air entry to the bases with no decreased or absent breath sounds. Musculoskeletal: Full range of motion to all extremities. No gross deformities appreciated. Neurologic:  Normal speech and language. No gross focal neurologic deficits are appreciated.   Skin:  Skin is warm, dry and intact. No rash noted. Psychiatric: Mood and affect are normal. Speech and behavior are normal. Patient exhibits appropriate insight and judgement.   ____________________________________________   LABS (all labs ordered are listed, but only abnormal results are displayed)  Labs Reviewed - No data to display ____________________________________________  EKG   ____________________________________________  RADIOLOGY   No results found.  ____________________________________________    PROCEDURES  Procedure(s) performed:    .Foreign Body Removal Date/Time: 03/19/2018 9:14 PM Performed by: Darletta Moll, PA-C Authorized by: Darletta Moll, PA-C  Consent: Verbal consent obtained. Risks and benefits: risks, benefits and alternatives were discussed Consent given by: patient Patient identity confirmed: verbally with patient Time out: Immediately prior to procedure a "time out" was called to verify the correct patient, procedure, equipment, support staff and site/side marked as required. Body area: ear Location details: right ear  Sedation: Patient sedated: no  Patient restrained: no Patient cooperative: yes Localization method: ENT speculum Removal mechanism: ear scoop, alligator forceps, balloon extraction and irrigation Complexity: complex 1 objects recovered. Objects recovered: winged insect Post-procedure assessment: foreign body removed Patient tolerance: Patient tolerated the procedure well with no immediate complications      Medications - No data to display   ____________________________________________   INITIAL IMPRESSION / ASSESSMENT AND PLAN / ED COURSE  Pertinent labs & imaging results that were available during my care of the patient were reviewed by me and considered in my medical decision making (see chart for details).  Review of the [place] CSRS was performed in accordance of the [place] prior to  dispensing any controlled drugs.      Patient's diagnosis is consistent with foreign body of the right ear.  Patient presents emergency department with a possible insect in the right EAC.  This is confirmed on visualization.  Using ear curette, irrigation, balloon extractor, foreign body successfully removed with no retained foreign body.  Patient will be placed on antibiotic eardrops prophylactically due to mild EAC trauma from removal.  Follow-up with ENT as needed..  Patient is given ED precautions to return to the ED for any worsening or new symptoms.     ____________________________________________  FINAL CLINICAL IMPRESSION(S) / ED DIAGNOSES  Final diagnoses:  Foreign body of right ear, initial encounter      NEW MEDICATIONS STARTED DURING THIS VISIT:  ED Discharge Orders         Ordered    neomycin-polymyxin-hydrocortisone (CORTISPORIN) OTIC solution  3 times daily     03/19/18 2116              This chart was dictated using voice recognition software/Dragon. Despite best efforts to proofread, errors can occur which can change the meaning. Any change was purely unintentional.    Brynda Peon 03/19/18 2119    Eula Listen, MD 03/20/18 [phone]

## 2019-08-01 ENCOUNTER — Other Ambulatory Visit: Payer: Self-pay

## 2019-08-01 ENCOUNTER — Encounter: Payer: Self-pay | Admitting: Emergency Medicine

## 2019-08-01 ENCOUNTER — Emergency Department
Admission: EM | Admit: 2019-08-01 | Discharge: 2019-08-01 | Disposition: A | Discharge status: Home / Self Care | Payer: Commercial Managed Care - PPO | Attending: Student | Admitting: Student

## 2019-08-01 DIAGNOSIS — H9202 Otalgia, left ear: Secondary | ICD-10-CM | POA: Diagnosis not present

## 2019-08-01 DIAGNOSIS — Z793 Long term (current) use of hormonal contraceptives: Secondary | ICD-10-CM | POA: Insufficient documentation

## 2019-08-01 DIAGNOSIS — K0889 Other specified disorders of teeth and supporting structures: Secondary | ICD-10-CM | POA: Insufficient documentation

## 2019-08-01 MED ORDER — AMOXICILLIN 875 MG PO TABS: 875.0000 mg | ORAL_TABLET | Freq: Two times a day (BID) | ORAL | 0 refills | Status: DC

## 2019-08-01 MED ORDER — IBUPROFEN 600 MG PO TABS: 600.0000 mg | ORAL_TABLET | Freq: Three times a day (TID) | ORAL | 0 refills | Status: DC | PRN

## 2019-08-01 NOTE — ED Provider Notes (Signed)
[facility] Emergency Department Provider Note  ____________________________________________   First MD Initiated Contact with Patient 08/01/19 [phone]     (approximate)  I have reviewed the triage vital signs and the nursing notes.   HISTORY  Chief Complaint Dental Pain and Otalgia   HPI Rebecca Francis is a 29 y.o. female presents to the ED with complaint of left lower dental pain and ear pain that began last evening.  Patient states that her tooth chipped off "sometime ago".  Patient denies any fever, chills, nausea or vomiting.  She states she called her dentist and was told they were booked out for the rest of the month but she did not make an appointment for next month.  She rates her pain as a 9 out of 10.        History reviewed. No pertinent past medical history.  Patient Active Problem List   Diagnosis Date Noted  . Premature rupture of membranes (PROM) affecting third pregnancy 05/18/2016  . Pregnancy 05/17/2016  . First trimester screening 12/04/2015    History reviewed. No pertinent surgical history.  Prior to Admission medications   Medication Sig Start Date End Date Taking? Authorizing Provider  amoxicillin (AMOXIL) 875 MG tablet Take 1 tablet (875 mg total) by mouth 2 (two) times daily. 08/01/19   Johnn Hai, PA-C  ibuprofen (ADVIL) 600 MG tablet Take 1 tablet (600 mg total) by mouth every 8 (eight) hours as needed. 08/01/19   Johnn Hai, PA-C  Norethindrone Acetate-Ethinyl Estrad-FE (JUNEL FE 24) 1-20 MG-MCG(24) tablet Take 1 tablet by mouth daily. 05/20/16   Ward, Honor Loh, MD    Allergies Patient has no known allergies.  History reviewed. No pertinent family history.  Social History Social History   Tobacco Use  . Smoking status: Never Smoker  . Smokeless tobacco: Never Used  Vaping Use  . Vaping Use: Never used  Substance Use Topics  . Alcohol use: No  . Drug use: No    Review of Systems Constitutional: No  fever/chills Eyes: No visual changes. ENT: No sore throat.  Positive dental pain.  Positive left ear pain. Cardiovascular: Denies chest pain. Respiratory: Denies shortness of breath. Gastrointestinal: No abdominal pain.  No nausea, no vomiting.  Musculoskeletal: Negative for muscle or joint aches. Skin: Negative for rash. Neurological: Negative for headaches, focal weakness or numbness. ____________________________________________   PHYSICAL EXAM:  VITAL SIGNS: ED Triage Vitals  Enc Vitals Group     BP 08/01/19 0650 118/82     Pulse Rate 08/01/19 0650 94     Resp 08/01/19 0650 20     Temp 08/01/19 0650 98.7 F (37.1 C)     Temp Source 08/01/19 0650 Oral     SpO2 08/01/19 0650 100 %     Weight 08/01/19 0649 195 lb (88.5 kg)     Height 08/01/19 0649 5\' 2"  (1.575 m)     Head Circumference --      Peak Flow --      Pain Score 08/01/19 0648 9     Pain Loc --      Pain Edu? --      Excl. in [place]? --     Constitutional: Alert and oriented. Well appearing and in no acute distress. Eyes: Conjunctivae are normal.  Head: Atraumatic. Nose: No congestion/rhinnorhea.  EACs and TMs are clear bilaterally. Mouth/Throat: Mucous membranes are moist.  Oropharynx non-erythematous.  Left lower molar is broken completely off at the gum.  Gum is  edematous without active drainage noted.  No facial swelling present. Neck: No stridor.   Hematological/Lymphatic/Immunilogical: No cervical lymphadenopathy. Cardiovascular: Normal rate, regular rhythm. Grossly normal heart sounds.  Good peripheral circulation. Respiratory: Normal respiratory effort.  No retractions. Lungs CTAB. Gastrointestinal: Soft and nontender. No distention. Musculoskeletal: Moves upper and lower extremities with any difficulty and ambulates without any assistance.. Neurologic:  Normal speech and language. No gross focal neurologic deficits are appreciated. No gait instability. Skin:  Skin is warm, dry and intact. No rash  noted. Psychiatric: Mood and affect are normal. Speech and behavior are normal.  ____________________________________________   LABS (all labs ordered are listed, but only abnormal results are displayed)  Labs Reviewed - No data to display   PROCEDURES  Procedure(s) performed (including Critical Care):  Procedures   ____________________________________________   INITIAL IMPRESSION / ASSESSMENT AND PLAN / ED COURSE  As part of my medical decision making, I reviewed the following data within the electronic MEDICAL RECORD NUMBER Notes from prior ED visits and [place] Controlled Substance Database   29 year old female presents to the ED with complaint of left lower dental pain.  Patient states that it began hurting last evening but she has had a broken tooth for "some time".  Patient has been taking over-the-counter medication without any relief.  She states that she called her dentist and was told that there were no openings for the month.  She did not make an appointment.  A prescription for amoxicillin 875 and ibuprofen was sent to her pharmacy.  She was also given a list of dental clinics should she need to cough someone else.  ____________________________________________   FINAL CLINICAL IMPRESSION(S) / ED DIAGNOSES  Final diagnoses:  Pain, dental     ED Discharge Orders         Ordered    amoxicillin (AMOXIL) 875 MG tablet  2 times daily     Discontinue  Reprint     08/01/19 0905    ibuprofen (ADVIL) 600 MG tablet  Every 8 hours PRN,   Status:  Discontinued     Reprint     08/01/19 0905    ibuprofen (ADVIL) 600 MG tablet  Every 8 hours PRN     Discontinue  Reprint     08/01/19 3536           Note:  This document was prepared using Dragon voice recognition software and may include unintentional dictation errors.    Johnn Hai, PA-C 08/01/19 1117    Lilia Pro., MD 08/01/19 1145

## 2019-08-01 NOTE — ED Notes (Signed)
Pt reports has a broken tooth on the left lower jaw. Pt reports tooth started hurting this weekend and now the pain goes into her left ear. Pt has been using OTC meds for relief but not much relief.

## 2019-08-01 NOTE — Discharge Instructions (Signed)
Call make an appointment for your dentist.  Asked them about the next availability.  Begin taking amoxicillin 875 twice daily for 10 days and ibuprofen as needed for discomfort and pain.  You may also take Tylenol with this medication if additional pain medication is needed.  There is also a list of dental clinics listed on your discharge papers that you may be seen earlier if needed.  The clinic and [facility] also takes walk-ins.  Call to see what those hours are.   OPTIONS FOR DENTAL FOLLOW UP CARE  [place] [place] [email or web address] [phone])  [facility] [phone])  [place] [phone])  [place] [phone])  [facility] [phone]) This clinic caters to the indigent population and is on a lottery system. Location: [employer] [facility], [place], [place], [place] Clinic Hours: Wednesdays from 6pm - 9pm, patients seen by a lottery system. For dates, call or go to [email or web address] Services: Cleanings, fillings and simple extractions. Payment Options: DENTAL WORK IS FREE OF CHARGE. Bring proof of income or support. Best way to get seen: Arrive at 5:15 pm - this is a lottery, NOT first come/first serve, so arriving earlier will not increase your chances of being seen.     [facility] [phone] Select option 1 for emergencies   Location: [facility], [place], [address], [place] Clinic Hours: No walk-ins accepted - call the day before to schedule an appointment. Check in times are 9:30 am and 1:30 pm. Services: Simple extractions, temporary fillings, pulpectomy/pulp debridement, uncomplicated abscess drainage. Payment Options: PAYMENT IS DUE AT THE TIME OF  SERVICE.  Fee is usually $100-200, additional surgical procedures (e.g. abscess drainage) may be extra. Cash, checks, Visa/MasterCard accepted.  Can file Medicaid if patient is covered for dental - patient should call case worker to check. No discount for [facility] patients. Best way to get seen: MUST call the day before and get onto the schedule. Can usually be seen the next 1-2 days. No walk-ins accepted.     [place] [phone]   Location: [place], [place] Clinic Hours: M, W, Th, F 8am or 1:30pm, Tues 9a or 1:30 - first come/first served. Services: Simple extractions, temporary fillings, uncomplicated abscess drainage.  You do not need to be an [facility] resident. Payment Options: PAYMENT IS DUE AT THE TIME OF SERVICE. Dental insurance, otherwise sliding scale - bring proof of income or support. Depending on income and treatment needed, cost is usually $50-200. Best way to get seen: Arrive early as it is first come/first served.     [facility] [phone]   Location: [place] [facility] Hours: Mon-Thu 8a-5p Services: Most basic dental services including extractions and fillings. Payment Options: PAYMENT IS DUE AT THE TIME OF SERVICE. Sliding scale, up to 50% off - bring proof if income or support. Medicaid with dental option accepted. Best way to get seen: Call to schedule an appointment, can usually be seen within 2 weeks OR they will try to see walk-ins - show up at [place] or 2p (you may have to wait).     [facility] [place] RESIDENTS ONLY   Location: [facility] [place] [address], [place], [place] [address] Clinic Hours: By appointment only.  Monday - Thursday 8am-5pm, Friday 8am-12pm Services: Cleanings, fillings, extractions. Payment Options: PAYMENT IS DUE AT THE TIME OF SERVICE. Cash, Visa  or MasterCard. Sliding scale - $30 minimum per service. Best way to get seen: Come in to office, complete packet and make an appointment - need proof of income or support monies for each household member and proof of [facility] residence. Usually takes about a month to get in.     [facility] [phone]   Location: [address] [place] Clinic Hours: Walk-in Urgent Care Dental Services are offered Monday-Friday mornings only. The numbers of emergencies accepted daily is limited to the number of providers available. Maximum 15 - Mondays, Wednesdays & Thursdays Maximum 10 - Tuesdays & Fridays Services: You do not need to be a [facility] resident to be seen for a dental emergency. Emergencies are defined as pain, swelling, abnormal bleeding, or dental trauma. Walkins will receive x-rays if needed. NOTE: Dental cleaning is not an emergency. Payment Options: PAYMENT IS DUE AT THE TIME OF SERVICE. Minimum co-pay is $40.00 for uninsured patients. Minimum co-pay is $3.00 for Medicaid with dental coverage. Dental Insurance is accepted and must be presented at time of visit. Medicare does not cover dental. Forms of payment: Cash, credit card, checks. Best way to get seen: If not previously registered with the clinic, walk-in dental registration begins at 7:15 am and is on a first come/first serve basis. If previously registered with the clinic, call to make an appointment.     [facility] [place] ONLY   Location: [address], [place], [place] Clinic Hours: Mon-Thu 10a-2p Services: Extractions only! Payment Options: FREE (donations accepted) - bring proof of income or support Best way to get seen: Call and schedule an appointment OR come at 8am on the 1st Monday of every month (except for holidays) when it is first come/first served.     [facility] [phone]   Location: [place],  [place] Clinic Hours: Friday mornings Services, Payment Options, Best way to get seen: Call for info

## 2019-08-01 NOTE — ED Triage Notes (Signed)
Patient ambulatory to triage with steady gait, without difficulty or distress noted; pt reports left sided dental & ear pain that began last night

## 2021-10-11 ENCOUNTER — Other Ambulatory Visit: Payer: Self-pay

## 2021-10-11 ENCOUNTER — Emergency Department: Payer: Commercial Managed Care - PPO

## 2021-10-11 ENCOUNTER — Observation Stay
Admission: EM | Admit: 2021-10-11 | Discharge: 2021-10-12 | Disposition: A | Discharge status: Home / Self Care | Payer: Commercial Managed Care - PPO | Attending: Internal Medicine | Admitting: Internal Medicine

## 2021-10-11 ENCOUNTER — Encounter: Payer: Self-pay | Admitting: Emergency Medicine

## 2021-10-11 DIAGNOSIS — R112 Nausea with vomiting, unspecified: Secondary | ICD-10-CM | POA: Diagnosis present

## 2021-10-11 DIAGNOSIS — K529 Noninfective gastroenteritis and colitis, unspecified: Secondary | ICD-10-CM | POA: Diagnosis not present

## 2021-10-11 DIAGNOSIS — R160 Hepatomegaly, not elsewhere classified: Secondary | ICD-10-CM | POA: Diagnosis not present

## 2021-10-11 DIAGNOSIS — E872 Acidosis, unspecified: Secondary | ICD-10-CM | POA: Insufficient documentation

## 2021-10-11 DIAGNOSIS — Z79899 Other long term (current) drug therapy: Secondary | ICD-10-CM | POA: Diagnosis not present

## 2021-10-11 DIAGNOSIS — E876 Hypokalemia: Secondary | ICD-10-CM | POA: Insufficient documentation

## 2021-10-11 DIAGNOSIS — N3000 Acute cystitis without hematuria: Secondary | ICD-10-CM | POA: Diagnosis not present

## 2021-10-11 DIAGNOSIS — I959 Hypotension, unspecified: Secondary | ICD-10-CM | POA: Insufficient documentation

## 2021-10-11 DIAGNOSIS — E669 Obesity, unspecified: Secondary | ICD-10-CM

## 2021-10-11 DIAGNOSIS — Z6835 Body mass index (BMI) 35.0-35.9, adult: Secondary | ICD-10-CM | POA: Insufficient documentation

## 2021-10-11 DIAGNOSIS — Z20822 Contact with and (suspected) exposure to covid-19: Secondary | ICD-10-CM | POA: Diagnosis not present

## 2021-10-11 DIAGNOSIS — B957 Other staphylococcus as the cause of diseases classified elsewhere: Secondary | ICD-10-CM | POA: Insufficient documentation

## 2021-10-11 DIAGNOSIS — E860 Dehydration: Secondary | ICD-10-CM | POA: Diagnosis not present

## 2021-10-11 DIAGNOSIS — R1110 Vomiting, unspecified: Secondary | ICD-10-CM

## 2021-10-11 DIAGNOSIS — D1803 Hemangioma of intra-abdominal structures: Secondary | ICD-10-CM | POA: Diagnosis not present

## 2021-10-11 LAB — COMPREHENSIVE METABOLIC PANEL
ALT: 12 U/L (ref 0–44)
AST: 18 U/L (ref 15–41)
Albumin: 3 g/dL — ABNORMAL LOW (ref 3.5–5.0)
Alkaline Phosphatase: 93 U/L (ref 38–126)
Anion gap: 12 (ref 5–15)
BUN: 11 mg/dL (ref 6–20)
CO2: 17 mmol/L — ABNORMAL LOW (ref 22–32)
Calcium: 8.5 mg/dL — ABNORMAL LOW (ref 8.9–10.3)
Chloride: 107 mmol/L (ref 98–111)
Creatinine, Ser: 0.75 mg/dL (ref 0.44–1.00)
GFR, Estimated: >60 mL/min (ref >60)
Glucose, Bld: 132 mg/dL — ABNORMAL HIGH (ref 70–99)
Potassium: 3.3 mmol/L — ABNORMAL LOW (ref 3.5–5.1)
Sodium: 136 mmol/L (ref 135–145)
Total Bilirubin: 1 mg/dL (ref 0.3–1.2)
Total Protein: 7.4 g/dL (ref 6.5–8.1)

## 2021-10-11 LAB — URINALYSIS, COMPLETE (UACMP) WITH MICROSCOPIC
Bacteria, UA: NONE SEEN
Bilirubin Urine: NEGATIVE
Glucose, UA: NEGATIVE mg/dL
Hgb urine dipstick: NEGATIVE
Ketones, ur: 5 mg/dL — AB
Leukocytes,Ua: NEGATIVE
Nitrite: NEGATIVE
Protein, ur: 100 mg/dL — AB
Specific Gravity, Urine: 1.02 (ref 1.005–1.030)
pH: 5 (ref 5.0–8.0)

## 2021-10-11 LAB — CBC WITH DIFFERENTIAL/PLATELET
Abs Immature Granulocytes: 0.18 10*3/uL — ABNORMAL HIGH (ref 0.00–0.07)
Basophils Absolute: 0.1 10*3/uL (ref 0.0–0.1)
Basophils Relative: 1 %
Eosinophils Absolute: 0 10*3/uL (ref 0.0–0.5)
Eosinophils Relative: 0 %
HCT: 38.2 % (ref 36.0–46.0)
Hemoglobin: 11.9 g/dL — ABNORMAL LOW (ref 12.0–15.0)
Immature Granulocytes: 1 %
Lymphocytes Relative: 2 %
Lymphs Abs: 0.4 10*3/uL — ABNORMAL LOW (ref 0.7–4.0)
MCH: 25.4 pg — ABNORMAL LOW (ref 26.0–34.0)
MCHC: 31.2 g/dL (ref 30.0–36.0)
MCV: 81.6 fL (ref 80.0–100.0)
Monocytes Absolute: 0.5 10*3/uL (ref 0.1–1.0)
Monocytes Relative: 3 %
Neutro Abs: 18.2 10*3/uL — ABNORMAL HIGH (ref 1.7–7.7)
Neutrophils Relative %: 93 %
Platelets: 268 10*3/uL (ref 150–400)
RBC: 4.68 MIL/uL (ref 3.87–5.11)
RDW: 15.8 % — ABNORMAL HIGH (ref 11.5–15.5)
WBC: 19.3 10*3/uL — ABNORMAL HIGH (ref 4.0–10.5)
nRBC: 0 % (ref 0.0–0.2)

## 2021-10-11 LAB — TSH: TSH: 1.244 u[IU]/mL (ref 0.350–4.500)

## 2021-10-11 LAB — RESP PANEL BY RT-PCR (FLU A&B, COVID) ARPGX2
Influenza A by PCR: NEGATIVE
Influenza B by PCR: NEGATIVE
SARS Coronavirus 2 by RT PCR: NEGATIVE

## 2021-10-11 LAB — LACTIC ACID, PLASMA: Lactic Acid, Venous: 1.3 mmol/L (ref 0.5–1.9)

## 2021-10-11 LAB — POC URINE PREG, ED: Preg Test, Ur: NEGATIVE

## 2021-10-11 LAB — T4, FREE: Free T4: 0.62 ng/dL (ref 0.61–1.12)

## 2021-10-11 LAB — MONONUCLEOSIS SCREEN: Mono Screen: NEGATIVE

## 2021-10-11 MED ORDER — POTASSIUM CHLORIDE IN NACL 20-0.9 MEQ/L-% IV SOLN
INTRAVENOUS | Status: DC
  Filled 2021-10-11: qty 1000

## 2021-10-11 MED ORDER — ENOXAPARIN SODIUM 60 MG/0.6ML IJ SOSY
0.5000 mg/kg | PREFILLED_SYRINGE | INTRAMUSCULAR | Status: DC
  Administered 2021-10-12: 45 mg via SUBCUTANEOUS
  Filled 2021-10-11: qty 0.6

## 2021-10-11 MED ORDER — KETOROLAC TROMETHAMINE 15 MG/ML IJ SOLN
15.0000 mg | Freq: Once | INTRAMUSCULAR | Status: AC
  Administered 2021-10-11: 15 mg via INTRAVENOUS
  Filled 2021-10-11: qty 1

## 2021-10-11 MED ORDER — SODIUM CHLORIDE 0.9 % IV SOLN
2.0000 g | Freq: Once | INTRAVENOUS | Status: AC
  Administered 2021-10-11: 2 g via INTRAVENOUS
  Filled 2021-10-11: qty 20

## 2021-10-11 MED ORDER — IOHEXOL 300 MG/ML  SOLN
100.0000 mL | Freq: Once | INTRAMUSCULAR | Status: AC | PRN
  Administered 2021-10-11: 100 mL via INTRAVENOUS

## 2021-10-11 MED ORDER — SODIUM CHLORIDE 0.9 % IV SOLN
25.0000 mg | Freq: Once | INTRAVENOUS | Status: AC
  Administered 2021-10-12: 25 mg via INTRAVENOUS
  Filled 2021-10-11: qty 1

## 2021-10-11 MED ORDER — ONDANSETRON HCL 4 MG/2ML IJ SOLN: 4.0000 mg | Freq: Four times a day (QID) | INTRAMUSCULAR | Status: DC | PRN

## 2021-10-11 MED ORDER — ONDANSETRON HCL 4 MG/2ML IJ SOLN
4.0000 mg | Freq: Once | INTRAMUSCULAR | Status: AC
  Administered 2021-10-11: 4 mg via INTRAVENOUS
  Filled 2021-10-11: qty 2

## 2021-10-11 MED ORDER — LACTATED RINGERS IV BOLUS (SEPSIS)
1000.0000 mL | Freq: Once | INTRAVENOUS | Status: AC
  Administered 2021-10-11: 1000 mL via INTRAVENOUS

## 2021-10-11 MED ORDER — TRAZODONE HCL 50 MG PO TABS: 25.0000 mg | ORAL_TABLET | Freq: Every evening | ORAL | Status: DC | PRN

## 2021-10-11 MED ORDER — ACETAMINOPHEN 325 MG RE SUPP: 650.0000 mg | Freq: Four times a day (QID) | RECTAL | Status: DC | PRN

## 2021-10-11 MED ORDER — LACTATED RINGERS IV BOLUS
1000.0000 mL | Freq: Once | INTRAVENOUS | Status: AC
  Administered 2021-10-11: 1000 mL via INTRAVENOUS

## 2021-10-11 MED ORDER — ACETAMINOPHEN 325 MG PO TABS: 650.0000 mg | ORAL_TABLET | Freq: Four times a day (QID) | ORAL | Status: DC | PRN

## 2021-10-11 MED ORDER — MAGNESIUM HYDROXIDE 400 MG/5ML PO SUSP
30.0000 mL | Freq: Every day | ORAL | Status: DC | PRN
Start: 2021-10-11 — End: 2021-10-12

## 2021-10-11 MED ORDER — ONDANSETRON HCL 4 MG PO TABS: 4.0000 mg | ORAL_TABLET | Freq: Four times a day (QID) | ORAL | Status: DC | PRN

## 2021-10-11 NOTE — H&P (Signed)
[place]   PATIENT NAME: Rebecca Francis    MR#:  [id number]  DATE OF BIRTH:  [birth year]  DATE OF ADMISSION:  10/11/2021  PRIMARY CARE PHYSICIAN: Rebecca Francis   Patient is coming from: Home  REQUESTING/REFERRING PHYSICIAN: Brenton Grills, MD  CHIEF COMPLAINT:   Chief Complaint  Patient presents with   Tachycardia  Intractable nausea and vomiting  HISTORY OF PRESENT ILLNESS:  Rebecca Francis is a 31 y.o. Caucasian female with no chronic medical history who presented to the emergency room with acute onset of intractable nausea and nonbilious and bloody vomiting since yesterday with associated lower abdominal pain.  She was noted to be tachycardic.  The patient was having urinary frequency and urgency with minimal dysuria on Thursday.  She had a telemedicine visit on Friday and was given a prescription for Macrobid for suspected UTI.  She started taking Macrobid yesterday.  After having vomiting she did not take it again.  She admits to mild cough with clear sputum.  She denied any measured fever however has been feeling warm and was having mild chills.  She denied any diarrhea.  No chest pain or palpitations.  No wheezing or dyspnea.  ED Course: When she came to the ER, BP was 101/54 with heart rate of 136 and respiratory rate of 18 and later 27.  UA showed 100 protein and 6-10 WBC and 6-10 RBCs with 5 ketones and no bacteria.  CMP revealed hypokalemia of 3.3 and CO2 of 17 and CBC showed leukocytosis 19.3 with neutrophilia and lactic acid was 1.3.  Urine pregnancy test was negative.  TSH was 1.24 and free T4 was 0.6. EKG as reviewed by me : EKG showed sinus tachycardia with rate 133 with nonspecific T wave inversion inferiorly Imaging: Abdominal pelvic CT scan showed no evidence for acute abnormality.  It showed nonspecific 1X 1.8 cm hypodense lesion in the hepatic caudate lobe with recommendation for MRI with and without contrast.  The patient was given 1 L bolus of IV  lactated Ringer, 2 g of IV Rocephin, 15 mg of IV Toradol and 4 mg of IV Zofran.  She will be admitted to a medical observation bed for further evaluation and management. PAST MEDICAL HISTORY:  History reviewed. No pertinent past medical history.  No chronic medical problems.  PAST SURGICAL HISTORY:  History reviewed. No pertinent surgical history.  No previous surgeries.  SOCIAL HISTORY:   Social History   Tobacco Use   Smoking status: Never   Smokeless tobacco: Never  Substance Use Topics   Alcohol use: No  No tobacco or alcohol abuse or illicit drug use.  FAMILY HISTORY:  History reviewed. No pertinent family history.  She denies any familial diseases.  DRUG ALLERGIES:  No Known Allergies  REVIEW OF SYSTEMS:   ROS As Francis history of present illness. All pertinent systems were reviewed above. Constitutional, HEENT, cardiovascular, respiratory, GI, GU, musculoskeletal, neuro, psychiatric, endocrine, integumentary and hematologic systems were reviewed and are otherwise negative/unremarkable except for positive findings mentioned above in the HPI.   MEDICATIONS AT HOME:   Prior to Admission medications   Medication Sig Start Date End Date Taking? Authorizing Provider  ISIBLOOM 0.15-30 MG-MCG tablet Take 1 tablet by mouth daily. 09/23/21  Yes [provider]  nitrofurantoin, macrocrystal-monohydrate, (MACROBID) 100 MG capsule Take 100 mg by mouth 2 (two) times daily. 10/10/21  Yes [provider]      VITAL SIGNS:  Blood pressure (!) 103/59, pulse  99, temperature 99 F (37.2 C), temperature source Oral, resp. rate 19, height '5\' 3"'$  (1.6 m), weight 90.7 kg, SpO2 99 %.  PHYSICAL EXAMINATION:  Physical Exam  GENERAL:  31 y.o.-year-old Caucasian female patient lying in the bed with no acute distress.  EYES: Pupils equal, round, reactive to light and accommodation. No scleral icterus. Extraocular muscles intact.  HEENT: Head atraumatic, normocephalic. Oropharynx  with slightly dry mucous membrane and tongue and nasopharynx clear.  NECK:  Supple, no jugular venous distention. No thyroid enlargement, no tenderness.  LUNGS: Normal breath sounds bilaterally, no wheezing, rales,rhonchi or crepitation. No use of accessory muscles of respiration.  CARDIOVASCULAR: Regular rate and rhythm, S1, S2 normal. No murmurs, rubs, or gallops.  ABDOMEN: Soft, nondistended, nontender. Bowel sounds present. No organomegaly or mass.  EXTREMITIES: No pedal edema, cyanosis, or clubbing.  NEUROLOGIC: Cranial nerves II through XII are intact. Muscle strength 5/5 in all extremities. Sensation intact. Gait not checked.  PSYCHIATRIC: The patient is alert and oriented x 3.  Normal affect and good eye contact. SKIN: No obvious rash, lesion, or ulcer.   LABORATORY PANEL:   CBC Recent Labs  Lab 10/11/21 1920  WBC 19.3*  HGB 11.9*  HCT 38.2  PLT 268   ------------------------------------------------------------------------------------------------------------------  Chemistries  Recent Labs  Lab 10/11/21 1920  NA 136  K 3.3*  CL 107  CO2 17*  GLUCOSE 132*  BUN 11  CREATININE 0.75  CALCIUM 8.5*  AST 18  ALT 12  ALKPHOS 93  BILITOT 1.0   ------------------------------------------------------------------------------------------------------------------  Cardiac Enzymes No results for input(s): "TROPONINI" in the last 168 hours. ------------------------------------------------------------------------------------------------------------------  RADIOLOGY:  CT ABDOMEN PELVIS W CONTRAST  Result Date: 10/11/2021 CLINICAL DATA:  31 year old female with acute abdominal and pelvic pain. EXAM: CT ABDOMEN AND PELVIS WITH CONTRAST TECHNIQUE: Multidetector CT imaging of the abdomen and pelvis was performed using the standard protocol following bolus administration of intravenous contrast. RADIATION DOSE REDUCTION: This exam was performed according to the departmental  dose-optimization program which includes automated exposure control, adjustment of the mA and/or kV according to patient size and/or use of iterative reconstruction technique. CONTRAST:  184m OMNIPAQUE IOHEXOL 300 MG/ML  SOLN COMPARISON:  06/30/2015 CT FINDINGS: Lower chest: Mild LEFT basilar atelectasis/scarring noted. Hepatobiliary: A 1 x 1.8 cm hypodense lesion within the hepatic caudate lobe is noted and can be definitely characterize as a cyst or hemangioma on this study. No other liver or gallbladder abnormalities are noted. There is no evidence of intrahepatic or extrahepatic biliary dilatation. Pancreas: Unremarkable Spleen: Unremarkable Adrenals/Urinary Tract: No significant kidney, adrenal or bladder abnormalities identified. There is no evidence hydronephrosis or urinary calculi. Stomach/Bowel: Stomach is within normal limits. Appendix appears normal. No evidence of bowel wall thickening, distention, or inflammatory changes. Vascular/Lymphatic: No significant vascular findings are present. No enlarged abdominal or pelvic lymph nodes. Reproductive: Uterus and bilateral adnexa are unremarkable. Other: No ascites, focal collection or pneumoperitoneum. Musculoskeletal: No acute or suspicious bony abnormalities are noted. IMPRESSION: 1. No evidence of acute abnormality. 2. Nonspecific 1 x 1.8 cm hypodense lesion within the hepatic caudate lobe. Recommend elective MRI with and without contrast. 3. No other significant abnormality. Electronically Signed   By: JMargarette CanadaM.D.   On: 10/11/2021 21:57      IMPRESSION AND PLAN:  Assessment and Plan: * Acute gastroenteritis - The patient will be admitted to a medical observation bed. - While this likely viral given significant leukocytosis we will continue IV Rocephin for now for the possibility of infectious  gastroenteritis. - Her urinalysis is unremarkable for UTI. - Differential diagnosis would include side effects from [place] which will be  discontinued. - We will place her on clear liquids and advance her diet as tolerated. - She will be on as needed antiemetic therapy as well as IV PPI therapy. - We will hydrate with IV normal saline with added potassium chloride.  Liver mass - This is a hepatic caudate lobe incidentally found mass. -We will obtain a brain MRI with and without contrast for further assessment.  Hypokalemia - This is clearly secondary to intractable nausea and vomiting. - We will replace potassium and check magnesium level.   DVT prophylaxis: Lovenox. Advanced Care Planning:  Code Status: full code. Family Communication:  The plan of care was discussed in details with the patient (and family). I answered all questions. The patient agreed to proceed with the above mentioned plan. Further management will depend upon hospital course. Disposition Plan: Back to previous home environment Consults called: none. All the records are reviewed and case discussed with ED provider.  Status is: Observation   I certify that at the time of admission, it is my clinical judgment that the patient will require  hospital care extending last than 2 midnights.                            Dispo: The patient is from: Home              Anticipated d/c is to: Home              Patient currently is not medically stable to d/c.              Difficult to place patient: No  Christel Mormon M.D on 10/12/2021 at 12:08 AM  [facility]   From 7 PM-7 AM, contact night-coverage [email or web address]  CC: Primary care physician; Rebecca Francis

## 2021-10-11 NOTE — Progress Notes (Signed)
PHARMACIST - PHYSICIAN COMMUNICATION  CONCERNING:  Enoxaparin (Lovenox) for DVT Prophylaxis    RECOMMENDATION: Patient was prescribed enoxaprin '40mg'$  q24 hours for VTE prophylaxis.   Filed Weights   10/11/21 1918  Weight: 90.7 kg (200 lb)    Body mass index is 35.43 kg/m.  Estimated Creatinine Clearance: 107.9 mL/min (by C-G formula based on SCr of 0.75 mg/dL).   Based on [place] patient is candidate for enoxaparin 0.'5mg'$ /kg TBW SQ every 24 hours based on BMI being >30.  DESCRIPTION: Pharmacy has adjusted enoxaparin dose per [facility] policy.  Patient is now receiving enoxaparin 0.5 mg/kg every 24 hours   Renda Rolls, PharmD, [facility] 10/11/2021 11:29 PM

## 2021-10-11 NOTE — Assessment & Plan Note (Addendum)
-   The patient will be admitted to a medical observation bed. - While this likely viral given significant leukocytosis we will continue IV Rocephin for now for the possibility of infectious gastroenteritis. - Her urinalysis is unremarkable for UTI. - Differential diagnosis would include side effects from [place]. - We will place her on clear liquids and advance her diet as tolerated. - She will be on as needed antiemetic therapy. - We will hydrate with IV normal saline with added potassium chloride.

## 2021-10-11 NOTE — ED Provider Notes (Signed)
[facility] Provider Note    Event Date/Time   First MD Initiated Contact with Patient 10/11/21 1926     (approximate)   History   Chief Complaint: Tachycardia   HPI  Rebecca Francis is a 31 y.o. female with no significant past medical history who comes the ED complaining of tachycardia, sweats, chills, nausea vomiting. She reports that she started feeling sick 2 days ago, did a telemedicine visit and was given Macrobid for possible UTI.  However, symptoms have worsened.  Denies dysuria.  No unusual vaginal bleeding or discharge.     Physical Exam   Triage Vital Signs: ED Triage Vitals  Enc Vitals Group     BP 10/11/21 1915 (!) 101/54     Pulse Rate 10/11/21 1915 (!) 136     Resp 10/11/21 1915 18     Temp 10/11/21 1915 99 F (37.2 C)     Temp Source 10/11/21 1915 Oral     SpO2 10/11/21 1915 100 %     Weight 10/11/21 1918 200 lb (90.7 kg)     Height 10/11/21 1918 '5\' 3"'$  (1.6 m)     Head Circumference --      Peak Flow --      Pain Score --      Pain Loc --      Pain Edu? --      Excl. in [place]? --     Most recent vital signs: Vitals:   10/11/21 2349 10/12/21 0020  BP: (!) 103/59   Pulse: 99   Resp: 19   Temp: 99 F (37.2 C) 98.2 F (36.8 C)  SpO2: 99%     General: Awake, no distress.  CV:  Good peripheral perfusion.  Tachycardia, heart rate 130. Resp:  Normal effort.  Clear to auscultation bilaterally Abd:  No distention.  Soft with suprapubic and left lower quadrant tenderness Other:  No lower extremity edema.  Dry mucous membranes.   ED Results / Procedures / Treatments   Labs (all labs ordered are listed, but only abnormal results are displayed) Labs Reviewed  COMPREHENSIVE METABOLIC PANEL - Abnormal; Notable for the following components:      Result Value   Potassium 3.3 (*)    CO2 17 (*)    Glucose, Bld 132 (*)    Calcium 8.5 (*)    Albumin 3.0 (*)    All other components within normal limits  CBC WITH  DIFFERENTIAL/PLATELET - Abnormal; Notable for the following components:   WBC 19.3 (*)    Hemoglobin 11.9 (*)    MCH 25.4 (*)    RDW 15.8 (*)    Neutro Abs 18.2 (*)    Lymphs Abs 0.4 (*)    Abs Immature Granulocytes 0.18 (*)    All other components within normal limits  URINALYSIS, COMPLETE (UACMP) WITH MICROSCOPIC - Abnormal; Notable for the following components:   Color, Urine AMBER (*)    APPearance HAZY (*)    Ketones, ur 5 (*)    Protein, ur 100 (*)    All other components within normal limits  MAGNESIUM - Abnormal; Notable for the following components:   Magnesium 1.6 (*)    All other components within normal limits  RESP PANEL BY RT-PCR (FLU A&B, COVID) ARPGX2  CULTURE, BLOOD (ROUTINE X 2)  CULTURE, BLOOD (ROUTINE X 2)  URINE CULTURE  LACTIC ACID, PLASMA  TSH  T4, FREE  MONONUCLEOSIS SCREEN  BASIC METABOLIC PANEL  CBC  HIV ANTIBODY (ROUTINE TESTING  W REFLEX)  POC URINE PREG, ED     EKG Interpreted by me Sinus tachycardia rate 133.  Normal axis, prolonged QTc of 559 ms.  Normal QRS ST segments and T waves.   RADIOLOGY CT abdomen pelvis interpreted by me, negative for appendicitis, free air, intra-abdominal abscess, or adnexal mass.  Radiology report reviewed.   PROCEDURES:  Procedures   MEDICATIONS ORDERED IN ED: Medications  enoxaparin (LOVENOX) injection 45 mg (has no administration in time range)  0.9 % NaCl with KCl 20 mEq/ L  infusion (has no administration in time range)  acetaminophen (TYLENOL) tablet 650 mg (has no administration in time range)    Or  acetaminophen (TYLENOL) suppository 650 mg (has no administration in time range)  traZODone (DESYREL) tablet 25 mg (has no administration in time range)  magnesium hydroxide (MILK OF MAGNESIA) suspension 30 mL (has no administration in time range)  ondansetron (ZOFRAN) tablet 4 mg (has no administration in time range)    Or  ondansetron (ZOFRAN) injection 4 mg (has no administration in time range)   cefTRIAXone (ROCEPHIN) 2 g in sodium chloride 0.9 % 100 mL IVPB (has no administration in time range)  lactated ringers bolus 1,000 mL (0 mLs Intravenous Stopped 10/11/21 2118)  ketorolac (TORADOL) 15 MG/ML injection 15 mg (15 mg Intravenous Given 10/11/21 2121)  ondansetron (ZOFRAN) injection 4 mg (4 mg Intravenous Given 10/11/21 2121)  iohexol (OMNIPAQUE) 300 MG/ML solution 100 mL (100 mLs Intravenous Contrast Given 10/11/21 2129)  lactated ringers bolus 1,000 mL (0 mLs Intravenous Stopped 10/12/21 0045)  cefTRIAXone (ROCEPHIN) 2 g in sodium chloride 0.9 % 100 mL IVPB (0 g Intravenous Stopped 10/11/21 2335)  promethazine (PHENERGAN) 25 mg in sodium chloride 0.9 % 50 mL IVPB (0 mg Intravenous Stopped 10/12/21 0046)     IMPRESSION / MDM / ASSESSMENT AND PLAN / ED COURSE  I reviewed the triage vital signs and the nursing notes.                              Differential diagnosis includes, but is not limited to, cystitis, pyelonephritis, ureterolithiasis, viral illness, mononucleosis, hyperthyroidism  Patient's presentation is most consistent with acute presentation with potential threat to life or bodily function.  Patient presents with abdominal pain nausea vomiting, brisk tachycardia.  During her ED evaluation and treatment with IV fluids, tachycardia has improved somewhat, but blood pressure remains borderline.  Symptoms have been intractable, persistent vomiting and p.o. intolerance despite multiple rounds of antiemetics.  Pain is persistent.  CT abdomen pelvis unremarkable, work-up unrevealing so far.  Case discussed with hospitalist for further evaluation.       FINAL CLINICAL IMPRESSION(S) / ED DIAGNOSES   Final diagnoses:  Intractable vomiting  Dehydration     Rx / DC Orders   ED Discharge Orders     None        Note:  This document was prepared using Dragon voice recognition software and may include unintentional dictation errors.   Carrie Mew, MD 10/12/21  [phone]

## 2021-10-11 NOTE — ED Triage Notes (Signed)
Pt presents via EMS with complaints of possible allergic reaction to microbid due to elevated HR at home in the 140s. Pt was prescribed the antibiotic for a UTI and she took one dose last night causing her to feel "ill". Today at 1500 she took another dose and she started experiencing N/V, cold sweats, and shaky. Denies fevers,  CP or SOB.

## 2021-10-11 NOTE — Assessment & Plan Note (Signed)
-   This is clearly secondary to intractable nausea and vomiting. - We will replace potassium and check magnesium level.

## 2021-10-12 ENCOUNTER — Observation Stay: Payer: Commercial Managed Care - PPO

## 2021-10-12 ENCOUNTER — Encounter: Payer: Self-pay | Admitting: Radiology

## 2021-10-12 DIAGNOSIS — R160 Hepatomegaly, not elsewhere classified: Secondary | ICD-10-CM

## 2021-10-12 DIAGNOSIS — I959 Hypotension, unspecified: Secondary | ICD-10-CM

## 2021-10-12 DIAGNOSIS — E669 Obesity, unspecified: Secondary | ICD-10-CM

## 2021-10-12 DIAGNOSIS — E876 Hypokalemia: Secondary | ICD-10-CM | POA: Diagnosis not present

## 2021-10-12 DIAGNOSIS — E872 Acidosis, unspecified: Secondary | ICD-10-CM

## 2021-10-12 DIAGNOSIS — K529 Noninfective gastroenteritis and colitis, unspecified: Secondary | ICD-10-CM | POA: Diagnosis not present

## 2021-10-12 DIAGNOSIS — D1803 Hemangioma of intra-abdominal structures: Secondary | ICD-10-CM

## 2021-10-12 LAB — BASIC METABOLIC PANEL
Anion gap: 7 (ref 5–15)
BUN: 9 mg/dL (ref 6–20)
CO2: 19 mmol/L — ABNORMAL LOW (ref 22–32)
Calcium: 7.4 mg/dL — ABNORMAL LOW (ref 8.9–10.3)
Chloride: 116 mmol/L — ABNORMAL HIGH (ref 98–111)
Creatinine, Ser: 0.57 mg/dL (ref 0.44–1.00)
GFR, Estimated: >60 mL/min (ref >60)
Glucose, Bld: 103 mg/dL — ABNORMAL HIGH (ref 70–99)
Potassium: 4.4 mmol/L (ref 3.5–5.1)
Sodium: 142 mmol/L (ref 135–145)

## 2021-10-12 LAB — CBC
HCT: 32.6 % — ABNORMAL LOW (ref 36.0–46.0)
Hemoglobin: 9.9 g/dL — ABNORMAL LOW (ref 12.0–15.0)
MCH: 25.2 pg — ABNORMAL LOW (ref 26.0–34.0)
MCHC: 30.4 g/dL (ref 30.0–36.0)
MCV: 83 fL (ref 80.0–100.0)
Platelets: 219 10*3/uL (ref 150–400)
RBC: 3.93 MIL/uL (ref 3.87–5.11)
RDW: 16.1 % — ABNORMAL HIGH (ref 11.5–15.5)
WBC: 13.8 10*3/uL — ABNORMAL HIGH (ref 4.0–10.5)
nRBC: 0 % (ref 0.0–0.2)

## 2021-10-12 LAB — MAGNESIUM: Magnesium: 1.6 mg/dL — ABNORMAL LOW (ref 1.7–2.4)

## 2021-10-12 MED ORDER — MIDODRINE HCL 5 MG PO TABS
5.0000 mg | ORAL_TABLET | Freq: Once | ORAL | Status: AC
  Administered 2021-10-12: 5 mg via ORAL
  Filled 2021-10-12: qty 1

## 2021-10-12 MED ORDER — SODIUM BICARBONATE 650 MG PO TABS: 1300.0000 mg | ORAL_TABLET | Freq: Four times a day (QID) | ORAL | 0 refills | Status: AC

## 2021-10-12 MED ORDER — SODIUM BICARBONATE 650 MG PO TABS
1300.0000 mg | ORAL_TABLET | Freq: Four times a day (QID) | ORAL | Status: DC
  Administered 2021-10-12 (×2): 1300 mg via ORAL
  Filled 2021-10-12 (×4): qty 2

## 2021-10-12 MED ORDER — GADOBUTROL 1 MMOL/ML IV SOLN
9.0000 mL | Freq: Once | INTRAVENOUS | Status: AC | PRN
  Administered 2021-10-12: 9 mL via INTRAVENOUS

## 2021-10-12 MED ORDER — SODIUM CHLORIDE 0.9 % IV BOLUS
250.0000 mL | INTRAVENOUS | Status: AC
  Administered 2021-10-12: 250 mL via INTRAVENOUS

## 2021-10-12 MED ORDER — MAGNESIUM SULFATE 2 GM/50ML IV SOLN
2.0000 g | Freq: Once | INTRAVENOUS | Status: AC
  Administered 2021-10-12: 2 g via INTRAVENOUS
  Filled 2021-10-12: qty 50

## 2021-10-12 MED ORDER — CEPHALEXIN 500 MG PO CAPS: 500.0000 mg | ORAL_CAPSULE | Freq: Three times a day (TID) | ORAL | 0 refills | Status: AC

## 2021-10-12 MED ORDER — ALBUMIN HUMAN 25 % IV SOLN
12.5000 g | Freq: Once | INTRAVENOUS | Status: AC
  Administered 2021-10-12: 12.5 g via INTRAVENOUS
  Filled 2021-10-12: qty 50

## 2021-10-12 MED ORDER — SODIUM CHLORIDE 0.9 % IV SOLN: 2.0000 g | INTRAVENOUS | Status: DC

## 2021-10-12 NOTE — ED Notes (Signed)
Attending provider notified of low BP, awaiting further instructions

## 2021-10-12 NOTE — Progress Notes (Signed)
       CROSS COVER NOTE  NAME: Rebecca Francis MRN: [id number] DOB : 06/11/1990    Date of Service   10/12/2021   HPI/Events of Note   Notified of ongoing hypotension despite 250 mL fluid bolus ordered by admitting physician. BP 80/49 MAP 60. RN reports patient's mentation has improved since arrival and she is now more alert.  Interventions   Plan: 12.5g Albumin--> Albumin 3.0 Midodrine 5 mg Continue IVF      This document was prepared using Dragon voice recognition software and may include unintentional dictation errors.  Neomia Glass DNP, MHA, FNP-BC Nurse Practitioner [facility] Pager [phone]

## 2021-10-12 NOTE — TOC Initial Note (Signed)
Transition of Care [facility]) - Initial/Assessment Note    Patient Details  Name: Rebecca Francis MRN: [id number] Date of Birth: December 24, 1990  Transition of [facility]) CM/SW Contact:    Shelbie Hutching, RN Phone Number: 10/12/2021, 4:42 PM  Clinical Narrative:                 Patient placed under observation for acute gastroenteritis.  TOC consult for PCP needs.  RNCM met with patient at the bedside in the emergency room.  Patient is medically cleared for discharge and glad to be going home.  Patient lives in [place] with her 3 children, she works for [employer]  Her father will be picking her up this afternoon.  Patient has insurance, informed her that most PCP providers in this area should be in network with her insurance.  She said she was thinking about [facility] since that is where she went when she was pregnant.  Provided patient with telephone numbers for [facility], they have 3 locations, one here in [place], one in [place], and one in [place].  RNCM also provided patient with information and number for [facility] as they usually are accepting new patients and can typically see new patient's sooner than some of the other practices around.    Patient verbalizes understanding and has no questions.   Expected Discharge Plan: Home/Self Care Barriers to Discharge: Barriers Resolved   Patient Goals and CMS Choice Patient states their goals for this hospitalization and ongoing recovery are:: Glad to be going home this afternoon      Expected Discharge Plan and Services Expected Discharge Plan: Home/Self Care   Discharge Planning Services: CM Consult, Follow-up appt scheduled   Living arrangements for the past 2 months: Single Family Home Expected Discharge Date: 10/12/21               DME Arranged: N/A DME Agency: NA       HH Arranged: NA HH Agency: NA        Prior Living Arrangements/Services Living arrangements for the past 2 months: Single Family Home Lives  with:: Minor Children Patient language and need for interpreter reviewed:: Yes Do you feel safe going back to the place where you live?: Yes      Need for Family Participation in Patient Care: Yes (Comment) Care giver support system in place?: Yes (comment)   Criminal Activity/Legal Involvement Pertinent to Current Situation/Hospitalization: No - Comment as needed  Activities of Daily Living Home Assistive Devices/Equipment: Contact lenses ADL Screening (condition at time of admission) Patient's cognitive ability adequate to safely complete daily activities?: Yes Is the patient deaf or have difficulty hearing?: No Does the patient have difficulty seeing, even when wearing glasses/contacts?: No Does the patient have difficulty concentrating, remembering, or making decisions?: No Patient able to express need for assistance with ADLs?: Yes Does the patient have difficulty dressing or bathing?: No Independently performs ADLs?: Yes (appropriate for developmental age) Does the patient have difficulty walking or climbing stairs?: No Weakness of Legs: None Weakness of Arms/Hands: None  Permission Sought/Granted                  Emotional Assessment Appearance:: Appears stated age Attitude/Demeanor/Rapport: Engaged Affect (typically observed): Accepting Orientation: : Oriented to Self, Oriented to Place, Oriented to  Time, Oriented to Situation Alcohol / Substance Use: Not Applicable Psych Involvement: No (comment)  Admission diagnosis:  Acute gastroenteritis [K52.9] Patient Active Problem List   Diagnosis Date Noted   Liver mass 10/12/2021  Liver hemangioma 10/12/2021   Obesity (BMI 30-39.9) 67/34/1937   Metabolic acidosis 90/24/0973   Hypotension 10/12/2021   Acute gastroenteritis 10/11/2021   Hypokalemia 10/11/2021   Premature rupture of membranes (PROM) affecting third pregnancy 05/18/2016   Pregnancy 05/17/2016   First trimester screening 12/04/2015   PCP:  Patient,  No Pcp Per Pharmacy:   [facility] [address]), [place]) [place] [address] Phone: [phone] Fax: [phone]     Social Determinants of Health (SDOH) Interventions    Readmission Risk Interventions     No data to display

## 2021-10-12 NOTE — Discharge Summary (Signed)
Physician Discharge Summary   Patient: Rebecca Francis MRN: [id number] DOB: 08/29/1990  Admit date:     10/11/2021  Discharge date: 10/12/21  Discharge Physician: Sharen Hones   PCP: Patient, No Pcp Per   Recommendations at discharge:   Follow up with PCP in 1 week   Discharge Diagnoses: Principal Problem:   Acute gastroenteritis Active Problems:   Hypokalemia   Liver mass   Liver hemangioma   Obesity (BMI 30-39.9)   Metabolic acidosis   Hypotension  Resolved Problems:   * No resolved hospital problems. *  Hospital Course: Rebecca Francis is a 31 y.o. Caucasian female with no chronic medical history who presented to the emergency room with acute onset of intractable nausea and nonbilious and bloody vomiting since yesterday with associated lower abdominal pain.  Patient also had a dysuria, was started on Macrobid 2 days ago for UTI.  Last period was 10 days ago. Upon arriving the hospital, her blood pressure was low, she was given IV fluids, she was also given albumin.  Blood pressure better afterwards. Blood culture urine culture sent out.  Patient was placed on Rocephin. Patient condition had improved today, currently she is tolerating diet without nausea vomiting.  Blood pressure more stable.  Assessment and Plan: Acute gastroenteritis. Hypotension secondary to dehydration. Patient received IV fluids, condition has improved.  Blood pressure much better.  Symptom has resolved, she is tolerating diet.  At this point, she is medically stable to be discharged.  Acute cystitis without hematuria. Patient had urinary symptoms for a few days, currently taking Macrobid started 2 days ago. She is given Rocephin while in the hospital, will continue 5 days of Keflex for now. Blood cultures so far negative.  Liver hemangioma. She had had a mass in the liver on CT scan, MRI confirmed hemangioma.  Follow-up with PCP as outpatient.  Hypokalemia. Hypomagnesemia. Metabolic  acidosis. Potassium has normalized, will give 2 g of magnesium sulfate IV before discharge.  Patient will be also given sodium bicarbonate 3 times a day for 3 doses for mild medical acidosis.      Consultants: None Procedures performed: None  Disposition: Home Diet recommendation:  Discharge Diet Orders (From admission, onward)     Start     Ordered   10/12/21 0000  Diet - low sodium heart healthy        10/12/21 0953           Cardiac diet DISCHARGE MEDICATION: Allergies as of 10/12/2021   No Known Allergies      Medication List     STOP taking these medications    nitrofurantoin (macrocrystal-monohydrate) 100 MG capsule Commonly known as: MACROBID       TAKE these medications    cephALEXin 500 MG capsule Commonly known as: KEFLEX Take 1 capsule (500 mg total) by mouth 3 (three) times daily for 5 days.   Isibloom 0.15-30 MG-MCG tablet Generic drug: desogestrel-ethinyl estradiol Take 1 tablet by mouth daily.   sodium bicarbonate 650 MG tablet Take 2 tablets (1,300 mg total) by mouth 4 (four) times daily for 3 doses.        Discharge Exam: Filed Weights   10/11/21 1918  Weight: 90.7 kg   General exam: Appears calm and comfortable  Respiratory system: Clear to auscultation. Respiratory effort normal. Cardiovascular system: S1 & S2 heard, RRR. No JVD, murmurs, rubs, gallops or clicks. No pedal edema. Gastrointestinal system: Abdomen is nondistended, soft and nontender. No organomegaly or masses felt. Normal bowel sounds heard.  Central nervous system: Alert and oriented. No focal neurological deficits. Extremities: Symmetric 5 x 5 power. Skin: No rashes, lesions or ulcers Psychiatry: Judgement and insight appear normal. Mood & affect appropriate.    Condition at discharge: good  The results of significant diagnostics from this hospitalization (including imaging, microbiology, ancillary and laboratory) are listed below for reference.   Imaging  Studies: MR ABDOMEN W WO CONTRAST  Result Date: 10/12/2021 CLINICAL DATA:  Liver lesion on CT EXAM: MRI ABDOMEN WITHOUT AND WITH CONTRAST TECHNIQUE: Multiplanar multisequence MR imaging of the abdomen was performed both before and after the administration of intravenous contrast. CONTRAST:  82m GADAVIST GADOBUTROL 1 MMOL/ML IV SOLN COMPARISON:  CT abdomen/pelvis dated [id number]: Lower chest: Lung bases are clear. Hepatobiliary: Liver is notable for a 10 mm T2 hyperintense lesion in the posterior caudate just anterior to the IVC (series 4/image 13) which demonstrates peripheral nodular discontinuous enhancement and delayed wash-in, compatible with a benign hemangioma. No suspicious hepatic lesions. Gallbladder is unremarkable. No intrahepatic or extrahepatic ductal dilatation. Pancreas:  Within normal limits. Spleen:  Within normal limits Adrenals/Urinary Tract:  Adrenal glands are within normal limits. Kidneys are within normal limits.  No hydronephrosis. Stomach/Bowel: Stomach is notable for a tiny hiatal hernia. Visualized bowel is unremarkable. Vascular/Lymphatic:  No evidence of abdominal aortic aneurysm. No suspicious abdominal lymphadenopathy. Other:  No abdominal ascites. Musculoskeletal: No focal osseous lesions. IMPRESSION: Liver lesion on CT corresponds to a benign hemangioma, as described above. No follow-up is recommended. Electronically Signed   By: SJulian HyM.D.   On: 10/12/2021 02:02   CT ABDOMEN PELVIS W CONTRAST  Result Date: 10/11/2021 CLINICAL DATA:  31year old female with acute abdominal and pelvic pain. EXAM: CT ABDOMEN AND PELVIS WITH CONTRAST TECHNIQUE: Multidetector CT imaging of the abdomen and pelvis was performed using the standard protocol following bolus administration of intravenous contrast. RADIATION DOSE REDUCTION: This exam was performed according to the departmental dose-optimization program which includes automated exposure control, adjustment of the mA  and/or kV according to patient size and/or use of iterative reconstruction technique. CONTRAST:  1081mOMNIPAQUE IOHEXOL 300 MG/ML  SOLN COMPARISON:  06/30/2015 CT FINDINGS: Lower chest: Mild LEFT basilar atelectasis/scarring noted. Hepatobiliary: A 1 x 1.8 cm hypodense lesion within the hepatic caudate lobe is noted and can be definitely characterize as a cyst or hemangioma on this study. No other liver or gallbladder abnormalities are noted. There is no evidence of intrahepatic or extrahepatic biliary dilatation. Pancreas: Unremarkable Spleen: Unremarkable Adrenals/Urinary Tract: No significant kidney, adrenal or bladder abnormalities identified. There is no evidence hydronephrosis or urinary calculi. Stomach/Bowel: Stomach is within normal limits. Appendix appears normal. No evidence of bowel wall thickening, distention, or inflammatory changes. Vascular/Lymphatic: No significant vascular findings are present. No enlarged abdominal or pelvic lymph nodes. Reproductive: Uterus and bilateral adnexa are unremarkable. Other: No ascites, focal collection or pneumoperitoneum. Musculoskeletal: No acute or suspicious bony abnormalities are noted. IMPRESSION: 1. No evidence of acute abnormality. 2. Nonspecific 1 x 1.8 cm hypodense lesion within the hepatic caudate lobe. Recommend elective MRI with and without contrast. 3. No other significant abnormality. Electronically Signed   By: JeMargarette Canada.D.   On: 10/11/2021 21:57    Microbiology: Results for orders placed or performed during the hospital encounter of 10/11/21  Blood Culture (routine x 2)     Status: None (Preliminary result)   Collection Time: 10/11/21  7:20 PM   Specimen: BLOOD  Result Value Ref Range Status   Specimen Description BLOOD  RIGHT HAND  Final   Special Requests   Final    BOTTLES DRAWN AEROBIC AND ANAEROBIC Blood Culture adequate volume   Culture   Final    NO GROWTH < 12 HOURS Performed at [facility], [address]  [place], [place] [address]    Report Status PENDING  Incomplete  Resp Panel by RT-PCR (Flu A&B, Covid) Anterior Nasal Swab     Status: None   Collection Time: 10/11/21  7:49 PM   Specimen: Anterior Nasal Swab  Result Value Ref Range Status   SARS Coronavirus 2 by RT PCR NEGATIVE NEGATIVE Final    Comment: (NOTE) SARS-CoV-2 target nucleic acids are NOT DETECTED.  The SARS-CoV-2 RNA is generally detectable in upper respiratory specimens during the acute phase of infection. The lowest concentration of SARS-CoV-2 viral copies this assay can detect is 138 copies/mL. A negative result does not preclude SARS-Cov-2 infection and should not be used as the sole basis for treatment or other patient management decisions. A negative result may occur with  improper specimen collection/handling, submission of specimen other than nasopharyngeal swab, presence of viral mutation(s) within the areas targeted by this assay, and inadequate number of viral copies(<138 copies/mL). A negative result must be combined with clinical observations, patient history, and epidemiological information. The expected result is Negative.  Fact Sheet for Patients:  EntrepreneurPulse.com.au  Fact Sheet for Healthcare Providers:  IncredibleEmployment.be  This test is no t yet approved or cleared by the [place] FDA and  has been authorized for detection and/or diagnosis of SARS-CoV-2 by FDA under an Emergency Use Authorization (EUA). This EUA will remain  in effect (meaning this test can be used) for the duration of the COVID-19 declaration under Section 564(b)(1) of the Act, 21 U.S.C.section 360bbb-3(b)(1), unless the authorization is terminated  or revoked sooner.       Influenza A by PCR NEGATIVE NEGATIVE Final   Influenza B by PCR NEGATIVE NEGATIVE Final    Comment: (NOTE) The Xpert Xpress SARS-CoV-2/FLU/RSV plus assay is intended as an aid in the diagnosis of influenza from  Nasopharyngeal swab specimens and should not be used as a sole basis for treatment. Nasal washings and aspirates are unacceptable for Xpert Xpress SARS-CoV-2/FLU/RSV testing.  Fact Sheet for Patients: EntrepreneurPulse.com.au  Fact Sheet for Healthcare Providers: IncredibleEmployment.be  This test is not yet approved or cleared by the [place] FDA and has been authorized for detection and/or diagnosis of SARS-CoV-2 by FDA under an Emergency Use Authorization (EUA). This EUA will remain in effect (meaning this test can be used) for the duration of the COVID-19 declaration under Section 564(b)(1) of the Act, 21 U.S.C. section 360bbb-3(b)(1), unless the authorization is terminated or revoked.  Performed at [facility], [address] [place], [place] [address]   Blood Culture (routine x 2)     Status: None (Preliminary result)   Collection Time: 10/11/21  9:27 PM   Specimen: BLOOD  Result Value Ref Range Status   Specimen Description BLOOD LEFT FA  Final   Special Requests   Final    BOTTLES DRAWN AEROBIC AND ANAEROBIC Blood Culture adequate volume   Culture   Final    NO GROWTH < 12 HOURS Performed at [facility], [address], [place], [place] [address]    Report Status PENDING  Incomplete    Labs: CBC: Recent Labs  Lab 10/11/21 1920 10/12/21 0515  WBC 19.3* 13.8*  NEUTROABS 18.2*  --   HGB 11.9* 9.9*  HCT 38.2 32.6*  MCV 81.6 83.0  PLT 268 737   Basic Metabolic Panel: Recent Labs  Lab 10/11/21 1920 10/11/21 1933 10/12/21 0515  NA 136  --  142  K 3.3*  --  4.4  CL 107  --  116*  CO2 17*  --  19*  GLUCOSE 132*  --  103*  BUN 11  --  9  CREATININE 0.75  --  0.57  CALCIUM 8.5*  --  7.4*  MG  --  1.6*  --    Liver Function Tests: Recent Labs  Lab 10/11/21 1920  AST 18  ALT 12  ALKPHOS 93  BILITOT 1.0  PROT 7.4  ALBUMIN 3.0*   CBG: No results for input(s): "GLUCAP" in the last 168  hours.  Discharge time spent: greater than 30 minutes.  Signed: Sharen Hones, MD [facility] 10/12/2021

## 2021-10-12 NOTE — Discharge Instructions (Signed)
Follow up with your PCP in 1 week.  ° °

## 2021-10-12 NOTE — Assessment & Plan Note (Signed)
-   This is a hepatic caudate lobe incidentally found mass. -We will obtain a brain MRI with and without contrast for further assessment.

## 2021-10-12 NOTE — Hospital Course (Signed)
Rebecca Francis is a 31 y.o. Caucasian female with no chronic medical history who presented to the emergency room with acute onset of intractable nausea and nonbilious and bloody vomiting since yesterday with associated lower abdominal pain.  Patient also had a dysuria, was started on Macrobid 2 days ago for UTI.  Last period was 10 days ago. Upon arriving the hospital, her blood pressure was low, she was given IV fluids, she was also given albumin.  Blood pressure better afterwards. Blood culture urine culture sent out.  Patient was placed on Rocephin. Patient condition had improved today, currently she is tolerating diet without nausea vomiting.  Blood pressure more stable.

## 2021-10-14 LAB — URINE CULTURE: Culture: >=100000 — AB

## 2021-10-15 NOTE — Progress Notes (Signed)
ED Antimicrobial Stewardship Positive Culture Follow Up   Rebecca Francis is an 31 y.o. female who presented to [facility] on 10/11/2021 with a chief complaint of intratactable nausea and blood vomiting attributable to acute gastroenteritis. Also presented with urinary symptoms x a few days.  Chief Complaint  Patient presents with   Tachycardia    Recent Results (from the past 720 hour(s))  Blood Culture (routine x 2)     Status: None (Preliminary result)   Collection Time: 10/11/21  7:20 PM   Specimen: BLOOD  Result Value Ref Range Status   Specimen Description BLOOD RIGHT HAND  Final   Special Requests   Final    BOTTLES DRAWN AEROBIC AND ANAEROBIC Blood Culture adequate volume   Culture   Final    NO GROWTH 4 DAYS Performed at [facility], [address] [place], [place] [address]    Report Status PENDING  Incomplete  Resp Panel by RT-PCR (Flu A&B, Covid) Anterior Nasal Swab     Status: None   Collection Time: 10/11/21  7:49 PM   Specimen: Anterior Nasal Swab  Result Value Ref Range Status   SARS Coronavirus 2 by RT PCR NEGATIVE NEGATIVE Final    Comment: (NOTE) SARS-CoV-2 target nucleic acids are NOT DETECTED.  The SARS-CoV-2 RNA is generally detectable in upper respiratory specimens during the acute phase of infection. The lowest concentration of SARS-CoV-2 viral copies this assay can detect is 138 copies/mL. A negative result does not preclude SARS-Cov-2 infection and should not be used as the sole basis for treatment or other patient management decisions. A negative result may occur with  improper specimen collection/handling, submission of specimen other than nasopharyngeal swab, presence of viral mutation(s) within the areas targeted by this assay, and inadequate number of viral copies(<138 copies/mL). A negative result must be combined with clinical observations, patient history, and epidemiological information. The expected result is Negative.  Fact  Sheet for Patients:  EntrepreneurPulse.com.au  Fact Sheet for Healthcare Providers:  IncredibleEmployment.be  This test is no t yet approved or cleared by the [place] FDA and  has been authorized for detection and/or diagnosis of SARS-CoV-2 by FDA under an Emergency Use Authorization (EUA). This EUA will remain  in effect (meaning this test can be used) for the duration of the COVID-19 declaration under Section 564(b)(1) of the Act, 21 U.S.C.section 360bbb-3(b)(1), unless the authorization is terminated  or revoked sooner.       Influenza A by PCR NEGATIVE NEGATIVE Final   Influenza B by PCR NEGATIVE NEGATIVE Final    Comment: (NOTE) The Xpert Xpress SARS-CoV-2/FLU/RSV plus assay is intended as an aid in the diagnosis of influenza from Nasopharyngeal swab specimens and should not be used as a sole basis for treatment. Nasal washings and aspirates are unacceptable for Xpert Xpress SARS-CoV-2/FLU/RSV testing.  Fact Sheet for Patients: EntrepreneurPulse.com.au  Fact Sheet for Healthcare Providers: IncredibleEmployment.be  This test is not yet approved or cleared by the [place] FDA and has been authorized for detection and/or diagnosis of SARS-CoV-2 by FDA under an Emergency Use Authorization (EUA). This EUA will remain in effect (meaning this test can be used) for the duration of the COVID-19 declaration under Section 564(b)(1) of the Act, 21 U.S.C. section 360bbb-3(b)(1), unless the authorization is terminated or revoked.  Performed at [facility], [address] [place], [place] [address]   Urine Culture     Status: Abnormal   Collection Time: 10/11/21  8:00 PM   Specimen:  In/Out Cath Urine  Result Value Ref Range Status   Specimen Description   Final    IN/OUT CATH URINE Performed at [facility], [address] [place], [place] [address]    Special Requests    Final    NONE Performed at [facility], [address] [place], [place] [address]    Culture >=100,000 COLONIES/mL STAPHYLOCOCCUS HAEMOLYTICUS (A)  Final   Report Status 10/14/2021 FINAL  Final   Organism ID, Bacteria STAPHYLOCOCCUS HAEMOLYTICUS (A)  Final      Susceptibility   Staphylococcus haemolyticus - MIC*    CIPROFLOXACIN <=0.5 SENSITIVE Sensitive     GENTAMICIN <=0.5 SENSITIVE Sensitive     NITROFURANTOIN <=16 SENSITIVE Sensitive     OXACILLIN >=4 RESISTANT Resistant     TETRACYCLINE <=1 SENSITIVE Sensitive     VANCOMYCIN <=0.5 SENSITIVE Sensitive     TRIMETH/SULFA <=10 SENSITIVE Sensitive     CLINDAMYCIN <=0.25 SENSITIVE Sensitive     RIFAMPIN <=0.5 SENSITIVE Sensitive     Inducible Clindamycin NEGATIVE Sensitive     * >=100,000 COLONIES/mL STAPHYLOCOCCUS HAEMOLYTICUS  Blood Culture (routine x 2)     Status: None (Preliminary result)   Collection Time: 10/11/21  9:27 PM   Specimen: BLOOD  Result Value Ref Range Status   Specimen Description BLOOD LEFT FA  Final   Special Requests   Final    BOTTLES DRAWN AEROBIC AND ANAEROBIC Blood Culture adequate volume   Culture   Final    NO GROWTH 4 DAYS Performed at [facility], [address] [place], [place] [address]    Report Status PENDING  Incomplete    '[x]'$  Treated with cephalexin, organism resistant to prescribed antimicrobial. Of note, patient received 1.5 days of Macrobid (out of 3 days).  Called patient, who reported feeling much better. Stated that urinary symptoms have resolved.   Advised patient to call primary care provider if symptoms reappear.   ED Provider: Dr. Gwenevere Ghazi, PharmD Clinical Pharmacist Monday - Friday phone -  [phone] Saturday - Sunday phone - [phone]

## 2021-10-16 LAB — CULTURE, BLOOD (ROUTINE X 2)
Culture: NO GROWTH
Culture: NO GROWTH
Special Requests: ADEQUATE
Special Requests: ADEQUATE

## 2022-09-20 ENCOUNTER — Ambulatory Visit: Payer: Commercial Managed Care - PPO | Admitting: Nurse Practitioner

## 2022-10-27 ENCOUNTER — Ambulatory Visit: Payer: Commercial Managed Care - PPO | Admitting: Nurse Practitioner

## 2022-12-22 ENCOUNTER — Encounter: Payer: Self-pay | Admitting: Nurse Practitioner

## 2022-12-22 ENCOUNTER — Ambulatory Visit: Payer: Commercial Managed Care - PPO | Admitting: Nurse Practitioner

## 2022-12-22 ENCOUNTER — Other Ambulatory Visit (HOSPITAL_COMMUNITY)
Admission: RE | Admit: 2022-12-22 | Discharge: 2022-12-22 | Disposition: A | Discharge status: Home / Self Care | Payer: Commercial Managed Care - PPO | Source: Ambulatory Visit | Attending: Nurse Practitioner | Admitting: Nurse Practitioner

## 2022-12-22 VITALS — BP 118/80 | HR 77 | Temp 98.7°F | Ht 63.0 in | Wt 213.2 lb

## 2022-12-22 DIAGNOSIS — K529 Noninfective gastroenteritis and colitis, unspecified: Secondary | ICD-10-CM

## 2022-12-22 DIAGNOSIS — Z1329 Encounter for screening for other suspected endocrine disorder: Secondary | ICD-10-CM

## 2022-12-22 DIAGNOSIS — E669 Obesity, unspecified: Secondary | ICD-10-CM | POA: Diagnosis not present

## 2022-12-22 DIAGNOSIS — Z13220 Encounter for screening for lipoid disorders: Secondary | ICD-10-CM

## 2022-12-22 DIAGNOSIS — Z124 Encounter for screening for malignant neoplasm of cervix: Secondary | ICD-10-CM | POA: Insufficient documentation

## 2022-12-22 DIAGNOSIS — Z01419 Encounter for gynecological examination (general) (routine) without abnormal findings: Secondary | ICD-10-CM | POA: Diagnosis not present

## 2022-12-22 DIAGNOSIS — R051 Acute cough: Secondary | ICD-10-CM

## 2022-12-22 DIAGNOSIS — Z3041 Encounter for surveillance of contraceptive pills: Secondary | ICD-10-CM | POA: Diagnosis not present

## 2022-12-22 LAB — COMPREHENSIVE METABOLIC PANEL
ALT: 10 U/L (ref 0–35)
AST: 13 U/L (ref 0–37)
Albumin: 3.5 g/dL (ref 3.5–5.2)
Alkaline Phosphatase: 115 U/L (ref 39–117)
BUN: 12 mg/dL (ref 6–23)
CO2: 24 meq/L (ref 19–32)
Calcium: 9.2 mg/dL (ref 8.4–10.5)
Chloride: 103 meq/L (ref 96–112)
Creatinine, Ser: 0.84 mg/dL (ref 0.40–1.20)
GFR: 91.39 mL/min (ref >60.00)
Glucose, Bld: 101 mg/dL — ABNORMAL HIGH (ref 70–99)
Potassium: 3.8 meq/L (ref 3.5–5.1)
Sodium: 135 meq/L (ref 135–145)
Total Bilirubin: 0.3 mg/dL (ref 0.2–1.2)
Total Protein: 7.4 g/dL (ref 6.0–8.3)

## 2022-12-22 LAB — CBC WITH DIFFERENTIAL/PLATELET
Basophils Absolute: 0.1 10*3/uL (ref 0.0–0.1)
Basophils Relative: 0.5 % (ref 0.0–3.0)
Eosinophils Absolute: 0.5 10*3/uL (ref 0.0–0.7)
Eosinophils Relative: 2.9 % (ref 0.0–5.0)
HCT: 36 % (ref 36.0–46.0)
Hemoglobin: 11.3 g/dL — ABNORMAL LOW (ref 12.0–15.0)
Lymphocytes Relative: 25.8 % (ref 12.0–46.0)
Lymphs Abs: 4.2 10*3/uL — ABNORMAL HIGH (ref 0.7–4.0)
MCHC: 31.4 g/dL (ref 30.0–36.0)
MCV: 81 fL (ref 78.0–100.0)
Monocytes Absolute: 1.1 10*3/uL — ABNORMAL HIGH (ref 0.1–1.0)
Monocytes Relative: 7.1 % (ref 3.0–12.0)
Neutro Abs: 10.3 10*3/uL — ABNORMAL HIGH (ref 1.4–7.7)
Neutrophils Relative %: 63.7 % (ref 43.0–77.0)
Platelets: 383 10*3/uL (ref 150.0–400.0)
RBC: 4.44 Mil/uL (ref 3.87–5.11)
RDW: 16.4 % — ABNORMAL HIGH (ref 11.5–15.5)
WBC: 16.2 10*3/uL — ABNORMAL HIGH (ref 4.0–10.5)

## 2022-12-22 LAB — HEMOGLOBIN A1C: Hgb A1c MFr Bld: 5.8 % (ref 4.6–6.5)

## 2022-12-22 LAB — LIPID PANEL
Cholesterol: 210 mg/dL — ABNORMAL HIGH (ref 0–200)
HDL: 58.2 mg/dL (ref >39.00)
LDL Cholesterol: 99 mg/dL (ref 0–99)
NonHDL: 151.89
Total CHOL/HDL Ratio: 4
Triglycerides: 265 mg/dL — ABNORMAL HIGH (ref 0.0–149.0)
VLDL: 53 mg/dL — ABNORMAL HIGH (ref 0.0–40.0)

## 2022-12-22 LAB — VITAMIN D 25 HYDROXY (VIT D DEFICIENCY, FRACTURES): VITD: 10.25 ng/mL — ABNORMAL LOW (ref 30.00–100.00)

## 2022-12-22 LAB — TSH: TSH: 4.1 u[IU]/mL (ref 0.35–5.50)

## 2022-12-22 MED ORDER — DROSPIRENONE-ETHINYL ESTRADIOL 3-0.02 MG PO TABS: 1.0000 | ORAL_TABLET | Freq: Every day | ORAL | 3 refills | Status: DC

## 2022-12-22 MED ORDER — PSEUDOEPH-BROMPHEN-DM 30-2-10 MG/5ML PO SYRP: 5.0000 mL | ORAL_SOLUTION | Freq: Four times a day (QID) | ORAL | 0 refills | Status: DC | PRN

## 2022-12-22 NOTE — Assessment & Plan Note (Addendum)
She has experienced weight gain on Isibloom without other menses issues. We will change her birth control pill. She should finish the current pack before starting the new one and use additional protection during the first week of the new pill. She does not smoke. Blood pressure well controlled. Denies any history of DVT/PE. She will contact with any menstrual changes.

## 2022-12-22 NOTE — Progress Notes (Signed)
Bethanie Dicker, NP-C Phone: [phone]  Rebecca Francis is a 32 y.o. female who presents today to establish care and for annual exam.   Discussed the use of AI scribe software for clinical note transcription with the patient, who gave verbal consent to proceed.  History of Present Illness   The patient, currently on Isibloom for birth control, expressed concerns about weight gain associated with this medication and expressed a desire to switch to a different birth control pill. She has been on this particular birth control since postpartum, with no other birth control trials in the past. Menstrual cycles are regular with no heavy bleeding.  The patient also reported frequent bowel movements, often loose, following meals. This is associated with abdominal cramping. She has noticed this pattern with certain foods, particularly greasy meals.  The patient has been experiencing a persistent cough for the past week, which has been disrupting her sleep due to coughing episodes. She suspects this may be due to postnasal drip. She denies any other respiratory symptoms, no congestion or sore throat. Denies shortness of breath or wheezing. Denies fevers.   In terms of reproductive health, the patient has never had a Pap smear for cervical cancer screening. She reports regular menstrual cycles and is sexually active. She has noticed an increase in vaginal discharge, described as cloudy, sometimes with an odor. She has a history of bacterial vaginosis.  The patient has three children and has never had any surgeries. She has been experiencing some elbow discomfort, for which she has been using a brace. No other joint pains or swelling were reported.  The patient's lifestyle includes a diet of mostly take-out food and little to no exercise. She does not smoke or consume alcohol. She has received two doses of the COVID-19 vaccine and is up-to-date on her tetanus shot. She has not received a flu shot this year.       Active Ambulatory Problems    Diagnosis Date Noted   Liver hemangioma 10/12/2021   Obesity (BMI 30-39.9) 10/12/2021   Well woman exam with routine gynecological exam 12/22/2022   Acute cough 12/22/2022   Oral contraceptive pill surveillance 12/22/2022   Postprandial diarrhea 12/22/2022   Resolved Ambulatory Problems    Diagnosis Date Noted   First trimester screening 12/04/2015   Pregnancy 05/17/2016   Premature rupture of membranes (PROM) affecting third pregnancy 05/18/2016   Acute gastroenteritis 10/11/2021   Hypokalemia 10/11/2021   Liver mass 10/12/2021   Metabolic acidosis 10/12/2021   Hypotension 10/12/2021   Past Medical History:  Diagnosis Date   Syphilis     Family History  Problem Relation Age of Onset   ADD / ADHD Son     Social History   Socioeconomic History   Marital status: Single    Spouse name: Not on file   Number of children: Not on file   Years of education: Not on file   Highest education level: 12th grade  Occupational History   Not on file  Tobacco Use   Smoking status: Never   Smokeless tobacco: Never  Vaping Use   Vaping status: Never Used  Substance and Sexual Activity   Alcohol use: No   Drug use: Never   Sexual activity: Yes    Birth control/protection: Pill  Other Topics Concern   Not on file  Social History Narrative   Not on file   Social Determinants of Health   Financial Resource Strain: Low Risk  (12/21/2022)   Overall Financial Resource Strain (CARDIA)  Difficulty of Paying Living Expenses: Not hard at all  Food Insecurity: No Food Insecurity (12/21/2022)   Hunger Vital Sign    Worried About Running Out of Food in the Last Year: Never true    Ran Out of Food in the Last Year: Never true  Transportation Needs: No Transportation Needs (12/21/2022)   PRAPARE - Administrator, Civil Service (Medical): No    Lack of Transportation (Non-Medical): No  Physical Activity: Unknown (12/21/2022)   Exercise Vital  Sign    Days of Exercise per Week: Patient declined    Minutes of Exercise per Session: Not on file  Stress: No Stress Concern Present (12/21/2022)   [employer] of Occupational Health - Occupational Stress Questionnaire    Feeling of Stress : Not at all  Social Connections: Socially Isolated (12/21/2022)   Social Connection and Isolation Panel [NHANES]    Frequency of Communication with Friends and Family: Twice a week    Frequency of Social Gatherings with Friends and Family: Never    Attends Religious Services: Never    Database administrator or Organizations: No    Attends Engineer, structural: Not on file    Marital Status: Never married  Catering manager Violence: Not on file    ROS  General:  Negative for unexplained weight loss, fever Skin: Negative for new or changing mole, sore that won't heal HEENT: Negative for trouble hearing, trouble seeing, ringing in ears, mouth sores, hoarseness, change in voice, dysphagia. CV:  Negative for chest pain, dyspnea, edema, palpitations Resp: Negative for dyspnea, hemoptysis GI: Negative for nausea, vomiting, constipation, abdominal pain, melena, hematochezia. GU: Negative for dysuria, incontinence, urinary hesitance, hematuria, polyuria, sexual difficulty, lumps in testicle or breasts MSK: Negative for muscle cramps or aches, joint pain or swelling Neuro: Negative for headaches, weakness, numbness, dizziness, passing out/fainting Psych: Negative for depression, anxiety, memory problems  Objective  Physical Exam Vitals:   12/22/22 0906  BP: 118/80  Pulse: 77  Temp: 98.7 F (37.1 C)  SpO2: 95%    BP Readings from Last 3 Encounters:  12/22/22 118/80  10/12/21 110/60  08/01/19 117/80   Wt Readings from Last 3 Encounters:  12/22/22 213 lb 3.2 oz (96.7 kg)  10/11/21 200 lb (90.7 kg)  08/01/19 195 lb (88.5 kg)    Physical Exam Exam conducted with a chaperone present Donavan Foil, CMA).  Constitutional:       General: She is not in acute distress.    Appearance: Normal appearance. She is obese.  HENT:     Head: Normocephalic.     Right Ear: Tympanic membrane normal.     Left Ear: Tympanic membrane normal.     Nose: Nose normal.     Mouth/Throat:     Mouth: Mucous membranes are moist.     Pharynx: Oropharynx is clear.  Eyes:     Conjunctiva/sclera: Conjunctivae normal.     Pupils: Pupils are equal, round, and reactive to light.  Neck:     Thyroid: No thyromegaly.  Cardiovascular:     Rate and Rhythm: Normal rate and regular rhythm.     Heart sounds: Normal heart sounds.  Pulmonary:     Effort: Pulmonary effort is normal.     Breath sounds: Normal breath sounds.  Abdominal:     General: Abdomen is flat. Bowel sounds are normal.     Palpations: Abdomen is soft. There is no mass.     Tenderness: There is no abdominal tenderness.  Genitourinary:    General: Normal vulva.     Pubic Area: No rash.      Labia:        Right: No rash or lesion.        Left: No rash or lesion.      Vagina: Normal. No vaginal discharge, tenderness or bleeding.     Cervix: Friability and cervical bleeding present. No discharge.     Uterus: Normal.   Musculoskeletal:        General: Normal range of motion.  Lymphadenopathy:     Cervical: No cervical adenopathy.  Skin:    General: Skin is warm and dry.     Findings: No rash.  Neurological:     General: No focal deficit present.     Mental Status: She is alert.  Psychiatric:        Mood and Affect: Mood normal.        Behavior: Behavior normal.    Assessment/Plan:   Well woman exam with routine gynecological exam Assessment & Plan: Physical exam complete. Lab work as outlined. Pap smear completed today. We will contact her with the results. She is confirmed up-to-date on tetanus vaccine. She declined flu vaccine today and has received 2 COVID vaccines. We advised regular self-breast exams and to report any changes. We plan to start mammograms at  age 26. Recommended establishing with a dentist and ophthalmologist for annual exams. Encouraged to work on [employer] and exercise. Return to care in one year, sooner as needed.   Orders: -     CBC with Differential/Platelet -     Comprehensive metabolic panel -     VITAMIN D 25 Hydroxy (Vit-D Deficiency, Fractures)  Acute cough Assessment & Plan: Her cough has been ongoing for a week, possibly due to postnasal drip. Denies any other symptoms. We prescribe Bromfed DM to be taken at bedtime and as needed during the day. Counseled on common side effects. She will contact if her cough persists or new symptoms arise.   Orders: -     Pseudoeph-Bromphen-DM; Take 5-10 mLs by mouth every 6 (six) hours as needed.  Dispense: 120 mL; Refill: 0  Postprandial diarrhea Assessment & Plan: She reports loose stools and abdominal cramping after eating certain foods. We advise keeping a food diary to identify potential triggers and will reevaluate if symptoms persist or worsen. Will consider [place] of gallbladder if symptoms persist.    Oral contraceptive pill surveillance Assessment & Plan: She has experienced weight gain on Isibloom without other menses issues. We will change her birth control pill. She should finish the current pack before starting the new one and use additional protection during the first week of the new pill. She does not smoke. Blood pressure well controlled. Denies any history of DVT/PE. She will contact with any menstrual changes.   Orders: -     Drospirenone-Ethinyl Estradiol; Take 1 tablet by mouth daily.  Dispense: 84 tablet; Refill: 3  Obesity (BMI 30-39.9) Assessment & Plan: Encouraged healthy diet and exercise. We will check A1c today.   Orders: -     Hemoglobin A1c  Screening for cervical cancer -     Cytology - PAP  Thyroid disorder screen -     TSH  Lipid screening -     Lipid panel   Return in about 1 year (around 12/22/2023) for Annual Exam, sooner  PRN.   Bethanie Dicker, NP-C [facility] [employer]

## 2022-12-22 NOTE — Assessment & Plan Note (Signed)
Her cough has been ongoing for a week, possibly due to postnasal drip. Denies any other symptoms. We prescribe Bromfed DM to be taken at bedtime and as needed during the day. Counseled on common side effects. She will contact if her cough persists or new symptoms arise.

## 2022-12-22 NOTE — Assessment & Plan Note (Signed)
She reports loose stools and abdominal cramping after eating certain foods. We advise keeping a food diary to identify potential triggers and will reevaluate if symptoms persist or worsen. Will consider [place] of gallbladder if symptoms persist.

## 2022-12-22 NOTE — Assessment & Plan Note (Signed)
Encouraged healthy diet and exercise. We will check A1c today.

## 2022-12-22 NOTE — Assessment & Plan Note (Signed)
Physical exam complete. Lab work as outlined. Pap smear completed today. We will contact her with the results. She is confirmed up-to-date on tetanus vaccine. She declined flu vaccine today and has received 2 COVID vaccines. We advised regular self-breast exams and to report any changes. We plan to start mammograms at age 32. Recommended establishing with a dentist and ophthalmologist for annual exams. Encouraged to work on [employer] and exercise. Return to care in one year, sooner as needed.

## 2022-12-24 LAB — CYTOLOGY - PAP
Chlamydia: NEGATIVE
Comment: NEGATIVE
Comment: NEGATIVE
Comment: NEGATIVE
Comment: NORMAL
Diagnosis: NEGATIVE
High risk HPV: NEGATIVE
Neisseria Gonorrhea: NEGATIVE
Trichomonas: NEGATIVE

## 2022-12-28 ENCOUNTER — Other Ambulatory Visit: Payer: Self-pay | Admitting: Nurse Practitioner

## 2022-12-28 DIAGNOSIS — E559 Vitamin D deficiency, unspecified: Secondary | ICD-10-CM

## 2022-12-28 MED ORDER — VITAMIN D (ERGOCALCIFEROL) 1.25 MG (50000 UNIT) PO CAPS: 50000.0000 [IU] | ORAL_CAPSULE | ORAL | 1 refills | Status: AC

## 2022-12-28 NOTE — Progress Notes (Unsigned)
Vit d def.

## 2022-12-29 ENCOUNTER — Telehealth: Payer: Self-pay

## 2022-12-29 NOTE — Telephone Encounter (Signed)
LVM for pt to CB in regards to lab results. Mychart msg has also been sent with lab result info

## 2022-12-30 ENCOUNTER — Other Ambulatory Visit: Payer: Self-pay

## 2022-12-30 DIAGNOSIS — D72829 Elevated white blood cell count, unspecified: Secondary | ICD-10-CM

## 2022-12-30 DIAGNOSIS — D649 Anemia, unspecified: Secondary | ICD-10-CM

## 2023-01-06 ENCOUNTER — Other Ambulatory Visit: Payer: Self-pay | Admitting: Nurse Practitioner

## 2023-01-06 ENCOUNTER — Other Ambulatory Visit (INDEPENDENT_AMBULATORY_CARE_PROVIDER_SITE_OTHER): Payer: Commercial Managed Care - PPO

## 2023-01-06 DIAGNOSIS — E611 Iron deficiency: Secondary | ICD-10-CM

## 2023-01-06 DIAGNOSIS — D649 Anemia, unspecified: Secondary | ICD-10-CM

## 2023-01-06 DIAGNOSIS — D72829 Elevated white blood cell count, unspecified: Secondary | ICD-10-CM | POA: Diagnosis not present

## 2023-01-06 LAB — CBC WITH DIFFERENTIAL/PLATELET
Basophils Absolute: 0.2 10*3/uL — ABNORMAL HIGH (ref 0.0–0.1)
Basophils Relative: 0.9 % (ref 0.0–3.0)
Eosinophils Absolute: 0.2 10*3/uL (ref 0.0–0.7)
Eosinophils Relative: 1.5 % (ref 0.0–5.0)
HCT: 38.3 % (ref 36.0–46.0)
Hemoglobin: 12 g/dL (ref 12.0–15.0)
Lymphocytes Relative: 22 % (ref 12.0–46.0)
Lymphs Abs: 3.8 10*3/uL (ref 0.7–4.0)
MCHC: 31.2 g/dL (ref 30.0–36.0)
MCV: 81.4 fL (ref 78.0–100.0)
Monocytes Absolute: 1 10*3/uL (ref 0.1–1.0)
Monocytes Relative: 6.1 % (ref 3.0–12.0)
Neutro Abs: 11.9 10*3/uL — ABNORMAL HIGH (ref 1.4–7.7)
Neutrophils Relative %: 69.5 % (ref 43.0–77.0)
Platelets: 311 10*3/uL (ref 150.0–400.0)
RBC: 4.7 Mil/uL (ref 3.87–5.11)
RDW: 16.1 % — ABNORMAL HIGH (ref 11.5–15.5)
WBC: 17.1 10*3/uL — ABNORMAL HIGH (ref 4.0–10.5)

## 2023-01-07 ENCOUNTER — Telehealth: Payer: Self-pay

## 2023-01-07 LAB — IRON,TIBC AND FERRITIN PANEL
%SAT: 6 % — ABNORMAL LOW (ref 16–45)
Ferritin: 5 ng/mL — ABNORMAL LOW (ref 16–154)
Iron: 28 ug/dL — ABNORMAL LOW (ref 40–190)
TIBC: 486 ug/dL — ABNORMAL HIGH (ref 250–450)

## 2023-01-07 NOTE — Telephone Encounter (Signed)
-----   Message from [facility] sent at 01/06/2023  4:38 PM EST ----- Her white count is more elevated than it was. I have placed the referral to Hematology as we discussed.

## 2023-01-07 NOTE — Telephone Encounter (Signed)
Pt called back and I read the note and she verbalized understanding

## 2023-01-07 NOTE — Telephone Encounter (Signed)
Lvm for pt to call back in regards to questions:   Her iron stores are low too. Does she have heavy periods? Any hx of iron deficiency? Hematology will address this also at her appointment. She likely will need iron infusions.

## 2023-01-07 NOTE — Telephone Encounter (Signed)
-----   Message from [facility] sent at 01/06/2023  4:38 PM EST ----- Her white count is more elevated than it was. I have placed the referral to Hematology as we discussed.

## 2023-01-07 NOTE — Telephone Encounter (Signed)
Lvm for pt to give office  a call back in regards to labs, see message below

## 2023-01-07 NOTE — Telephone Encounter (Signed)
Noted. See result msg

## 2023-01-21 ENCOUNTER — Inpatient Hospital Stay: Payer: Commercial Managed Care - PPO

## 2023-01-21 ENCOUNTER — Inpatient Hospital Stay: Payer: Commercial Managed Care - PPO | Attending: Oncology | Admitting: Oncology

## 2023-01-21 ENCOUNTER — Encounter: Payer: Self-pay | Admitting: Oncology

## 2023-01-21 VITALS — BP 111/72 | HR 84 | Temp 97.9°F | Resp 18 | Ht 63.0 in | Wt 210.6 lb

## 2023-01-21 DIAGNOSIS — E611 Iron deficiency: Secondary | ICD-10-CM | POA: Insufficient documentation

## 2023-01-21 DIAGNOSIS — D72829 Elevated white blood cell count, unspecified: Secondary | ICD-10-CM | POA: Diagnosis not present

## 2023-01-21 LAB — IRON AND TIBC
Iron: 47 ug/dL (ref 28–170)
Saturation Ratios: 9 % — ABNORMAL LOW (ref 10.4–31.8)
TIBC: 532 ug/dL — ABNORMAL HIGH (ref 250–450)
UIBC: 485 ug/dL

## 2023-01-21 LAB — FOLATE: Folate: 13.1 ng/mL (ref >5.9)

## 2023-01-21 LAB — CBC (CANCER CENTER ONLY)
HCT: 37 % (ref 36.0–46.0)
Hemoglobin: 11.6 g/dL — ABNORMAL LOW (ref 12.0–15.0)
MCH: 25.8 pg — ABNORMAL LOW (ref 26.0–34.0)
MCHC: 31.4 g/dL (ref 30.0–36.0)
MCV: 82.4 fL (ref 80.0–100.0)
Platelet Count: 338 10*3/uL (ref 150–400)
RBC: 4.49 MIL/uL (ref 3.87–5.11)
RDW: 15.7 % — ABNORMAL HIGH (ref 11.5–15.5)
WBC Count: 16.4 10*3/uL — ABNORMAL HIGH (ref 4.0–10.5)
nRBC: 0 % (ref 0.0–0.2)

## 2023-01-21 LAB — FERRITIN: Ferritin: 7 ng/mL — ABNORMAL LOW (ref 11–307)

## 2023-01-21 LAB — VITAMIN B12: Vitamin B-12: 216 pg/mL (ref 180–914)

## 2023-01-21 NOTE — Progress Notes (Signed)
[facility]  Telephone:([phone] Fax:(336) [phone]  ID: Rebecca Francis OB: [birth year]  MR#: [id number]  ZDG#:[id number]  Patient Care Team: Bethanie Dicker, NP as PCP - General (Nurse Practitioner)  CHIEF COMPLAINT: Leukocytosis and iron deficiency.  INTERVAL HISTORY: Patient is a 32 year old female who was noted to have a persistent leukocytosis as well as iron deficiency without anemia.  She currently feels well and is asymptomatic.  She has no neurologic complaints.  She denies any recent fevers or illnesses.  She has a good appetite and denies weight loss.  She has no chest pain, shortness of breath, cough, or hemoptysis.  She denies any nausea, vomiting, constipation, or diarrhea.  She has no urinary complaints.  Patient feels at her baseline and offers no specific complaints today.  REVIEW OF SYSTEMS:   Review of Systems  Constitutional: Negative.  Negative for fever, malaise/fatigue and weight loss.  Respiratory: Negative.  Negative for cough, hemoptysis and shortness of breath.   Cardiovascular: Negative.  Negative for chest pain and leg swelling.  Gastrointestinal: Negative.  Negative for abdominal pain, blood in stool and melena.  Genitourinary: Negative.  Negative for dysuria.  Musculoskeletal: Negative.  Negative for back pain.  Skin: Negative.  Negative for rash.  Neurological: Negative.  Negative for dizziness, focal weakness, weakness and headaches.  Psychiatric/Behavioral: Negative.  The patient is not nervous/anxious.     As per HPI. Otherwise, a complete review of systems is negative.  PAST MEDICAL HISTORY: Past Medical History:  Diagnosis Date   Syphilis    Well woman exam with routine gynecological exam 12/22/2022    PAST SURGICAL HISTORY: History reviewed. No pertinent surgical history.  FAMILY HISTORY: Family History  Problem Relation Age of Onset   ADD / ADHD Son     ADVANCED DIRECTIVES (Y/N):  N  HEALTH MAINTENANCE: Social  History   Tobacco Use   Smoking status: Never   Smokeless tobacco: Never  Vaping Use   Vaping status: Never Used  Substance Use Topics   Alcohol use: No   Drug use: Never     Colonoscopy:  PAP:  Bone density:  Lipid panel:  No Known Allergies  Current Outpatient Medications  Medication Sig Dispense Refill   drospirenone-ethinyl estradiol (YAZ) 3-0.02 MG tablet Take 1 tablet by mouth daily. 84 tablet 3   Vitamin D, Ergocalciferol, (DRISDOL) 1.25 MG (50000 UNIT) CAPS capsule Take 1 capsule (50,000 Units total) by mouth every 7 (seven) days. 13 capsule 1   brompheniramine-pseudoephedrine-DM 30-2-10 MG/5ML syrup Take 5-10 mLs by mouth every 6 (six) hours as needed. (Patient not taking: Reported on 01/21/2023) 120 mL 0   No current facility-administered medications for this visit.    OBJECTIVE: Vitals:   01/21/23 1131  BP: 111/72  Pulse: 84  Resp: 18  Temp: 97.9 F (36.6 C)  SpO2: 100%     Body mass index is 37.31 kg/m.    ECOG FS:0 - Asymptomatic  General: Well-developed, well-nourished, no acute distress. Eyes: Pink conjunctiva, anicteric sclera. HEENT: Normocephalic, moist mucous membranes. Lungs: No audible wheezing or coughing. Heart: Regular rate and rhythm. Abdomen: Soft, nontender, no obvious distention. Musculoskeletal: No edema, cyanosis, or clubbing. Neuro: Alert, answering all questions appropriately. Cranial nerves grossly intact. Skin: No rashes or petechiae noted. Psych: Normal affect. Lymphatics: No cervical, calvicular, axillary or inguinal LAD.   LAB RESULTS:  Lab Results  Component Value Date   NA 135 12/22/2022   K 3.8 12/22/2022   CL 103 12/22/2022   CO2 24  12/22/2022   GLUCOSE 101 (H) 12/22/2022   BUN 12 12/22/2022   CREATININE 0.84 12/22/2022   CALCIUM 9.2 12/22/2022   PROT 7.4 12/22/2022   ALBUMIN 3.5 12/22/2022   AST 13 12/22/2022   ALT 10 12/22/2022   ALKPHOS 115 12/22/2022   BILITOT 0.3 12/22/2022   GFRNONAA >60 10/12/2021    GFRAA >60 06/30/2015    Lab Results  Component Value Date   WBC 17.1 (H) 01/06/2023   NEUTROABS 11.9 (H) 01/06/2023   HGB 12.0 01/06/2023   HCT 38.3 01/06/2023   MCV 81.4 01/06/2023   PLT 311.0 01/06/2023   Lab Results  Component Value Date   IRON 28 (L) 01/06/2023   TIBC 486 (H) 01/06/2023   IRONPCTSAT 6 (L) 01/06/2023   Lab Results  Component Value Date   FERRITIN 5 (L) 01/06/2023     STUDIES: No results found.  ASSESSMENT: Leukocytosis and iron deficiency.  PLAN:    Leukocytosis: Upon review of patient's chart, she has had a persistently elevated total white blood cell count since June 2013 ranging between 10.8 and 19.3.  Peripheral blood flow cytometry and BCR-ABL mutation were ordered today for completeness and are pending at time of dictation.  No intervention is needed at this time.  Patient does not require bone marrow biopsy.  Return to clinic in 3 months with repeat laboratory work and further evaluation. Iron deficiency without anemia: Patient noted to have significantly reduced iron stores, but a normal hemoglobin.  After discussion with the patient, she wishes to try oral iron supplementation first.  If there is no improvement in her iron stores, will consider IV Venofer at that time.  Return to clinic in 3 months as above.  I spent a total of 45 minutes reviewing chart data, face-to-face evaluation with the patient, counseling and coordination of care as detailed above.   Patient expressed understanding and was in agreement with this plan. She also understands that She can call clinic at any time with any questions, concerns, or complaints.    Jeralyn Ruths, MD   01/21/2023 12:14 PM

## 2023-01-25 LAB — COMP PANEL: LEUKEMIA/LYMPHOMA

## 2023-01-30 LAB — BCR-ABL1, CML/ALL, PCR, QUANT
E1A2 Transcript: <0.0032 %
Interpretation (BCRAL):: NEGATIVE
b2a2 transcript: <0.0032 %
b3a2 transcript: <0.0032 %

## 2023-02-23 ENCOUNTER — Telehealth: Payer: Commercial Managed Care - PPO | Admitting: Physician Assistant

## 2023-02-23 DIAGNOSIS — R3989 Other symptoms and signs involving the genitourinary system: Secondary | ICD-10-CM

## 2023-02-23 MED ORDER — CEPHALEXIN 500 MG PO CAPS: 500.0000 mg | ORAL_CAPSULE | Freq: Two times a day (BID) | ORAL | 0 refills | Status: AC

## 2023-02-23 NOTE — Progress Notes (Signed)
 I have spent 5 minutes in review of e-visit questionnaire, review and updating patient chart, medical decision making and response to patient.   Piedad Climes, PA-C

## 2023-02-23 NOTE — Progress Notes (Signed)

## 2023-04-06 ENCOUNTER — Ambulatory Visit: Payer: Commercial Managed Care - PPO

## 2023-04-22 ENCOUNTER — Other Ambulatory Visit: Payer: Self-pay

## 2023-04-22 DIAGNOSIS — D72829 Elevated white blood cell count, unspecified: Secondary | ICD-10-CM

## 2023-04-25 ENCOUNTER — Inpatient Hospital Stay: Payer: Commercial Managed Care - PPO | Attending: Oncology

## 2023-04-25 DIAGNOSIS — E611 Iron deficiency: Secondary | ICD-10-CM | POA: Insufficient documentation

## 2023-04-25 DIAGNOSIS — D72829 Elevated white blood cell count, unspecified: Secondary | ICD-10-CM | POA: Insufficient documentation

## 2023-04-25 LAB — CBC WITH DIFFERENTIAL (CANCER CENTER ONLY)
Abs Immature Granulocytes: 0.07 10*3/uL (ref 0.00–0.07)
Basophils Absolute: 0.1 10*3/uL (ref 0.0–0.1)
Basophils Relative: 1 %
Eosinophils Absolute: 0.4 10*3/uL (ref 0.0–0.5)
Eosinophils Relative: 3 %
HCT: 38.2 % (ref 36.0–46.0)
Hemoglobin: 12.4 g/dL (ref 12.0–15.0)
Immature Granulocytes: 1 %
Lymphocytes Relative: 29 %
Lymphs Abs: 4.3 10*3/uL — ABNORMAL HIGH (ref 0.7–4.0)
MCH: 28 pg (ref 26.0–34.0)
MCHC: 32.5 g/dL (ref 30.0–36.0)
MCV: 86.2 fL (ref 80.0–100.0)
Monocytes Absolute: 1.2 10*3/uL — ABNORMAL HIGH (ref 0.1–1.0)
Monocytes Relative: 8 %
Neutro Abs: 8.6 10*3/uL — ABNORMAL HIGH (ref 1.7–7.7)
Neutrophils Relative %: 58 %
Platelet Count: 317 10*3/uL (ref 150–400)
RBC: 4.43 MIL/uL (ref 3.87–5.11)
RDW: 13.9 % (ref 11.5–15.5)
WBC Count: 14.6 10*3/uL — ABNORMAL HIGH (ref 4.0–10.5)
nRBC: 0 % (ref 0.0–0.2)

## 2023-04-26 ENCOUNTER — Encounter: Payer: Self-pay | Admitting: Oncology

## 2023-04-26 ENCOUNTER — Inpatient Hospital Stay: Payer: Commercial Managed Care - PPO

## 2023-04-26 ENCOUNTER — Inpatient Hospital Stay (HOSPITAL_BASED_OUTPATIENT_CLINIC_OR_DEPARTMENT_OTHER): Payer: Commercial Managed Care - PPO | Admitting: Oncology

## 2023-04-26 VITALS — BP 122/80 | HR 79 | Temp 98.3°F | Resp 18 | Ht 63.0 in | Wt 220.0 lb

## 2023-04-26 DIAGNOSIS — D72829 Elevated white blood cell count, unspecified: Secondary | ICD-10-CM | POA: Diagnosis not present

## 2023-04-26 DIAGNOSIS — D509 Iron deficiency anemia, unspecified: Secondary | ICD-10-CM | POA: Diagnosis not present

## 2023-04-26 NOTE — Progress Notes (Unsigned)
 [facility]  Telephone:(336) [phone] Fax:(336) [phone]  ID: Rebecca Francis OB: [birth year]  MR#: [id number]  OZD#:[id number]  Patient Care Team: Bethanie Dicker, NP as PCP - General (Nurse Practitioner)  CHIEF COMPLAINT: Leukocytosis and iron deficiency.  INTERVAL HISTORY: Patient returns to clinic today for repeat laboratory work, further evaluation, and consideration of additional IV iron.  She continues to feel well and remains asymptomatic.  She does not complain of any weakness or fatigue.  She has no neurologic complaints.  She denies any recent fevers or illnesses.  She has a good appetite and denies weight loss.  She has no chest pain, shortness of breath, cough, or hemoptysis.  She denies any nausea, vomiting, constipation, or diarrhea.  She has no urinary complaints.  Patient offers no specific complaints today.  REVIEW OF SYSTEMS:   Review of Systems  Constitutional: Negative.  Negative for fever, malaise/fatigue and weight loss.  Respiratory: Negative.  Negative for cough, hemoptysis and shortness of breath.   Cardiovascular: Negative.  Negative for chest pain and leg swelling.  Gastrointestinal: Negative.  Negative for abdominal pain, blood in stool and melena.  Genitourinary: Negative.  Negative for dysuria.  Musculoskeletal: Negative.  Negative for back pain.  Skin: Negative.  Negative for rash.  Neurological: Negative.  Negative for dizziness, focal weakness, weakness and headaches.  Psychiatric/Behavioral: Negative.  The patient is not nervous/anxious.     As per HPI. Otherwise, a complete review of systems is negative.  PAST MEDICAL HISTORY: Past Medical History:  Diagnosis Date   Syphilis    Well woman exam with routine gynecological exam 12/22/2022    PAST SURGICAL HISTORY: History reviewed. No pertinent surgical history.  FAMILY HISTORY: Family History  Problem Relation Age of Onset   ADD / ADHD Son     ADVANCED DIRECTIVES (Y/N):   N  HEALTH MAINTENANCE: Social History   Tobacco Use   Smoking status: Never   Smokeless tobacco: Never  Vaping Use   Vaping status: Never Used  Substance Use Topics   Alcohol use: No   Drug use: Never     Colonoscopy:  PAP:  Bone density:  Lipid panel:  No Known Allergies  Current Outpatient Medications  Medication Sig Dispense Refill   drospirenone-ethinyl estradiol (YAZ) 3-0.02 MG tablet Take 1 tablet by mouth daily. 84 tablet 3   Vitamin D, Ergocalciferol, (DRISDOL) 1.25 MG (50000 UNIT) CAPS capsule Take 1 capsule (50,000 Units total) by mouth every 7 (seven) days. 13 capsule 1   No current facility-administered medications for this visit.    OBJECTIVE: Vitals:   04/26/23 0913  BP: 122/80  Pulse: 79  Resp: 18  Temp: 98.3 F (36.8 C)  SpO2: 100%     Body mass index is 38.97 kg/m.    ECOG FS:0 - Asymptomatic  General: Well-developed, well-nourished, no acute distress. Eyes: Pink conjunctiva, anicteric sclera. HEENT: Normocephalic, moist mucous membranes. Lungs: No audible wheezing or coughing. Heart: Regular rate and rhythm. Abdomen: Soft, nontender, no obvious distention. Musculoskeletal: No edema, cyanosis, or clubbing. Neuro: Alert, answering all questions appropriately. Cranial nerves grossly intact. Skin: No rashes or petechiae noted. Psych: Normal affect.  LAB RESULTS:  Lab Results  Component Value Date   NA 135 12/22/2022   K 3.8 12/22/2022   CL 103 12/22/2022   CO2 24 12/22/2022   GLUCOSE 101 (H) 12/22/2022   BUN 12 12/22/2022   CREATININE 0.84 12/22/2022   CALCIUM 9.2 12/22/2022   PROT 7.4 12/22/2022   ALBUMIN 3.5 12/22/2022  AST 13 12/22/2022   ALT 10 12/22/2022   ALKPHOS 115 12/22/2022   BILITOT 0.3 12/22/2022   GFRNONAA >60 10/12/2021   GFRAA >60 06/30/2015    Lab Results  Component Value Date   WBC 14.6 (H) 04/25/2023   NEUTROABS 8.6 (H) 04/25/2023   HGB 12.4 04/25/2023   HCT 38.2 04/25/2023   MCV 86.2 04/25/2023   PLT  317 04/25/2023   Lab Results  Component Value Date   IRON 47 01/21/2023   TIBC 532 (H) 01/21/2023   IRONPCTSAT 9 (L) 01/21/2023   Lab Results  Component Value Date   FERRITIN 7 (L) 01/21/2023     STUDIES: No results found.  ASSESSMENT: Leukocytosis and iron deficiency.  PLAN:    Leukocytosis: Upon review of patient's chart, she has had a persistently elevated total white blood cell count since June 2013 ranging between 10.8 and 19.3.  Peripheral blood flow cytometry and BCR-ABL mutation are both negative.  No intervention is needed at this time.  Patient does not require bone marrow biopsy.  Return to clinic in 6 months with repeat laboratory work and further evaluation.   Iron deficiency without anemia: Patient's hemoglobin has improved with oral iron supplementation.  Unfortunately, iron stores were not drawn today.  Will not institute IV iron at this time.  Continue oral iron supplementation.  Return to clinic in 6 months with laboratory work and video-assisted telemedicine visit.    I spent a total of 20 minutes reviewing chart data, face-to-face evaluation with the patient, counseling and coordination of care as detailed above.    Patient expressed understanding and was in agreement with this plan. She also understands that She can call clinic at any time with any questions, concerns, or complaints.    Jeralyn Ruths, MD   04/27/2023 8:52 AM

## 2023-10-26 ENCOUNTER — Inpatient Hospital Stay

## 2023-10-27 ENCOUNTER — Inpatient Hospital Stay: Admitting: Oncology

## 2023-12-12 ENCOUNTER — Other Ambulatory Visit: Payer: Self-pay | Admitting: Nurse Practitioner

## 2023-12-12 DIAGNOSIS — Z3041 Encounter for surveillance of contraceptive pills: Secondary | ICD-10-CM

## 2023-12-28 ENCOUNTER — Encounter: Payer: Commercial Managed Care - PPO | Admitting: Nurse Practitioner
